# Patient Record
Sex: Female | Born: 1950 | Race: Black or African American | Hispanic: No | Smoking: Never smoker
Health system: Southern US, Community
[De-identification: ages and names within clinical notes are randomized; demographics above are authoritative.]

## PROBLEM LIST (undated history)

## (undated) DIAGNOSIS — E7800 Pure hypercholesterolemia, unspecified: Secondary | ICD-10-CM

## (undated) DIAGNOSIS — I10 Essential (primary) hypertension: Secondary | ICD-10-CM

## (undated) HISTORY — PX: REDUCTION MAMMAPLASTY: SUR839

## (undated) HISTORY — PX: BREAST SURGERY: SHX581

## (undated) HISTORY — DX: Pure hypercholesterolemia, unspecified: E78.00

## (undated) HISTORY — PX: CATARACT EXTRACTION, BILATERAL: SHX1313

## (undated) HISTORY — PX: ABDOMINAL HYSTERECTOMY: SHX81

## (undated) HISTORY — DX: Essential (primary) hypertension: I10

---

## 1998-07-12 ENCOUNTER — Other Ambulatory Visit: Admission: RE | Admit: 1998-07-12 | Discharge: 1998-07-12 | Payer: Self-pay | Admitting: Gynecology

## 1999-07-11 ENCOUNTER — Other Ambulatory Visit: Admission: RE | Admit: 1999-07-11 | Discharge: 1999-07-11 | Payer: Self-pay | Admitting: Gynecology

## 1999-11-06 ENCOUNTER — Encounter: Payer: Self-pay | Admitting: Gynecology

## 1999-11-06 ENCOUNTER — Encounter: Admission: RE | Admit: 1999-11-06 | Discharge: 1999-11-06 | Payer: Self-pay | Admitting: Gynecology

## 2000-10-01 ENCOUNTER — Other Ambulatory Visit: Admission: RE | Admit: 2000-10-01 | Discharge: 2000-10-01 | Payer: Self-pay | Admitting: Gynecology

## 2000-11-14 ENCOUNTER — Encounter: Payer: Self-pay | Admitting: Gynecology

## 2000-11-14 ENCOUNTER — Encounter: Admission: RE | Admit: 2000-11-14 | Discharge: 2000-11-14 | Payer: Self-pay | Admitting: Gynecology

## 2001-09-15 ENCOUNTER — Other Ambulatory Visit: Admission: RE | Admit: 2001-09-15 | Discharge: 2001-09-15 | Payer: Self-pay | Admitting: Gynecology

## 2001-10-27 ENCOUNTER — Encounter (INDEPENDENT_AMBULATORY_CARE_PROVIDER_SITE_OTHER): Payer: Self-pay | Admitting: *Deleted

## 2001-10-27 ENCOUNTER — Ambulatory Visit (HOSPITAL_BASED_OUTPATIENT_CLINIC_OR_DEPARTMENT_OTHER): Admission: RE | Admit: 2001-10-27 | Discharge: 2001-10-28 | Payer: Self-pay | Admitting: Specialist

## 2002-09-16 ENCOUNTER — Other Ambulatory Visit: Admission: RE | Admit: 2002-09-16 | Discharge: 2002-09-16 | Payer: Self-pay | Admitting: Gynecology

## 2003-02-04 ENCOUNTER — Encounter: Admission: RE | Admit: 2003-02-04 | Discharge: 2003-02-04 | Payer: Self-pay | Admitting: Specialist

## 2003-02-04 ENCOUNTER — Encounter: Payer: Self-pay | Admitting: Specialist

## 2003-02-15 ENCOUNTER — Encounter: Payer: Self-pay | Admitting: Specialist

## 2003-02-15 ENCOUNTER — Encounter: Admission: RE | Admit: 2003-02-15 | Discharge: 2003-02-15 | Payer: Self-pay | Admitting: Specialist

## 2003-09-28 ENCOUNTER — Encounter: Admission: RE | Admit: 2003-09-28 | Discharge: 2003-09-28 | Payer: Self-pay | Admitting: Specialist

## 2003-09-28 ENCOUNTER — Other Ambulatory Visit: Admission: RE | Admit: 2003-09-28 | Discharge: 2003-09-28 | Payer: Self-pay | Admitting: Gynecology

## 2004-11-02 ENCOUNTER — Encounter: Admission: RE | Admit: 2004-11-02 | Discharge: 2004-11-02 | Payer: Self-pay | Admitting: Gynecology

## 2005-10-12 ENCOUNTER — Other Ambulatory Visit: Admission: RE | Admit: 2005-10-12 | Discharge: 2005-10-12 | Payer: Self-pay | Admitting: Family Medicine

## 2006-04-10 ENCOUNTER — Encounter: Admission: RE | Admit: 2006-04-10 | Discharge: 2006-04-10 | Payer: Self-pay | Admitting: Family Medicine

## 2006-12-13 ENCOUNTER — Encounter: Admission: RE | Admit: 2006-12-13 | Discharge: 2006-12-13 | Payer: Self-pay | Admitting: Family Medicine

## 2007-05-29 ENCOUNTER — Encounter: Admission: RE | Admit: 2007-05-29 | Discharge: 2007-05-29 | Payer: Self-pay | Admitting: Gynecology

## 2008-06-01 ENCOUNTER — Encounter: Admission: RE | Admit: 2008-06-01 | Discharge: 2008-06-01 | Payer: Self-pay | Admitting: Gynecology

## 2009-07-28 ENCOUNTER — Encounter: Admission: RE | Admit: 2009-07-28 | Discharge: 2009-07-28 | Payer: Self-pay | Admitting: Gynecology

## 2010-08-28 ENCOUNTER — Encounter: Admission: RE | Admit: 2010-08-28 | Discharge: 2010-08-28 | Payer: Self-pay | Admitting: Family Medicine

## 2011-03-16 NOTE — Op Note (Signed)
[facility]  Patient:    Toni Shaw, Toni Shaw Visit Number: [id number] MRN: [id number]          Service Type: DSU Location: [facility] Attending Physician:  Gustavus Messing Dictated by:   Yaakov Guthrie. Shon Hough, M.D. Proc. Date: 10/27/01 Admit Date:  10/27/2001                             Operative Report  CLINICAL NOTE:  This is a 60 year old lady with severe, severe macromastia. She is only about 5 feet tall and has triple D to E bra size breasts.  She has had increased pain, discomfort, intertrigo.  She has taken medications including analgesia, Tylenol, Motrin, and Advil, with no avail.  PROCEDURES PLANNED:  Bilateral breast reductions using the inferior pedicle technique, with excision of accessory breast tissue in the right and left axillary and latissimus dorsi regions.  SURGEON:  Yaakov Guthrie. Shon Hough, M.D.  ASSISTANT:  Margaretha Sheffield.  ANESTHESIA:  General.  DESCRIPTION OF PROCEDURE:  Preoperatively the patient was sat up and drawn up for the inferior pedicle reduction mammoplasty, re-marking the nipple-areolar complexes from 20 cm from the suprasternal notch to the area.  After this, the patient underwent general anesthesia, intubated orally.  Prep was done to the chest and breast areas in a routine fashion using Betadine soap and solution, walled off with sterile towels and drapes so as to make a sterile field. Xylocaine 0.25% with epinephrine was injected locally, a total of 150 cc per side.  The areas were scored with #15 blade, then the skin over the inferior pedicle was de-epithelialized with a #20 blade.  The medial and lateral fatty dermal pedicles were incised down to the underlying subcutaneous tissue. AFter proper hemostasis, the new keyhole area was also debulked.  Laterally more tissue was taken out laterally in the axillary areas.  Liposuction was fashioned to remove large volumes of accessory breast tissue.  After  proper hemostasis, flaps were transposed and stayed with 3-0 Prolene.  Subcutaneous closure was done with 3-0 Monocryl x 2 layers, then a running subcuticular stitch of 3-0 Monocryl and 5-0 Monocryl throughout the inverted T.  The wounds were drained with #10 Blake drains, which were placed in the depths of the wound and brought out through the lateral most portion of the incision and secured with 3-0 Prolene.  At the end of the procedure, the nipple-areolar complexes were examined, showing excellent blood supply.  The wounds were cleansed.  Benzoin was applied, then Steri-Strips half-inch, Xeroform, 4 x 4s, ABDs, Hypafix tape.  She was then taken to recovery in excellent condition.  Patient to be admitted overnight at [facility] day surgical center, discharged in the morning.  Will see her back in the office in approximately two more days for re-examination and dressing change.  All instructions given to the family postoperatively. Dictated by:   Yaakov Guthrie. Shon Hough, M.D. Attending Physician:  Gustavus Messing DD:  10/27/01 TD:  10/27/01 Job: [id number]

## 2011-08-15 ENCOUNTER — Other Ambulatory Visit: Payer: Self-pay | Admitting: Family Medicine

## 2011-08-15 DIAGNOSIS — Z1231 Encounter for screening mammogram for malignant neoplasm of breast: Secondary | ICD-10-CM

## 2011-08-30 ENCOUNTER — Ambulatory Visit
Admission: RE | Admit: 2011-08-30 | Discharge: 2011-08-30 | Disposition: A | Discharge status: Home / Self Care | Payer: 59 | Source: Ambulatory Visit | Attending: Family Medicine | Admitting: Family Medicine

## 2011-08-30 DIAGNOSIS — Z1231 Encounter for screening mammogram for malignant neoplasm of breast: Secondary | ICD-10-CM

## 2012-08-20 ENCOUNTER — Other Ambulatory Visit: Payer: Self-pay | Admitting: Gynecology

## 2012-08-20 DIAGNOSIS — Z1231 Encounter for screening mammogram for malignant neoplasm of breast: Secondary | ICD-10-CM

## 2012-08-20 DIAGNOSIS — Z9889 Other specified postprocedural states: Secondary | ICD-10-CM

## 2012-09-08 ENCOUNTER — Ambulatory Visit
Admission: RE | Admit: 2012-09-08 | Discharge: 2012-09-08 | Disposition: A | Discharge status: Home / Self Care | Payer: 59 | Source: Ambulatory Visit | Attending: Gynecology | Admitting: Gynecology

## 2012-09-08 DIAGNOSIS — Z9889 Other specified postprocedural states: Secondary | ICD-10-CM

## 2012-09-08 DIAGNOSIS — Z1231 Encounter for screening mammogram for malignant neoplasm of breast: Secondary | ICD-10-CM

## 2013-08-18 ENCOUNTER — Other Ambulatory Visit: Payer: Self-pay

## 2013-08-18 DIAGNOSIS — Z1231 Encounter for screening mammogram for malignant neoplasm of breast: Secondary | ICD-10-CM

## 2013-10-08 ENCOUNTER — Ambulatory Visit (INDEPENDENT_AMBULATORY_CARE_PROVIDER_SITE_OTHER): Payer: 59 | Admitting: Gynecology

## 2013-10-08 ENCOUNTER — Encounter: Payer: Self-pay | Admitting: Gynecology

## 2013-10-08 VITALS — BP 124/78 | Ht 60.0 in | Wt 133.0 lb

## 2013-10-08 DIAGNOSIS — Z01419 Encounter for gynecological examination (general) (routine) without abnormal findings: Secondary | ICD-10-CM

## 2013-10-08 DIAGNOSIS — N952 Postmenopausal atrophic vaginitis: Secondary | ICD-10-CM

## 2013-10-08 NOTE — Progress Notes (Addendum)
Toni Shaw 1951/08/29 [id number]        62 y.o.  G1P1 new patient for annual exam.  Former patient of Dr. Nicholas Lose.  Doing well without complaints.  Past medical history,surgical history, problem list, medications, allergies, family history and social history were all reviewed and documented in the EPIC chart.  ROS:  Performed and pertinent positives and negatives are included in the history, assessment and plan .  Exam: Kim assistant Filed Vitals:   10/08/13 1035  BP: 124/78  Height: 5' (1.524 m)  Weight: 133 lb (60.328 kg)   General appearance  Normal Skin grossly normal Head/Neck normal with no cervical or supraclavicular adenopathy thyroid normal Lungs  clear Cardiac RR, without RMG Abdominal  soft, nontender, without masses, organomegaly or hernia Breasts  examined lying and sitting without masses, retractions, discharge or axillary adenopathy. Bilateral reduction scars. Pelvic  Ext/BUS/vagina  Normal with mild atrophic changes   Adnexa  Without masses or tenderness    Anus and perineum  Normal   Rectovaginal  Normal sphincter tone without palpated masses or tenderness.    Assessment/Plan:  62 y.o. G1P1 female for annual exam.   1. Postmenopausal/atrophic genital changes. Status post TAH for leiomyoma at age 34.  Patient without significant symptoms of hot flushes, night sweats, vaginal dryness or dyspareunia. Will continue to monitor. 2. Pap smear reported 2013. No Pap smear done today. No history of abnormal Pap smears previously. Review current screening guidelines and options to stop screening altogether as she is status post hysterectomy for benign indications or less frequent screening intervals reviewed. We'll readdress on an annual basis. 3. Mammography due now and patient is to schedule. SBE monthly reviewed. 4. DEXA 2014 reported normal through her primary physician's office. Repeat at 5 year interval. Increase calcium vitamin D reviewed. 5. Colonoscopy 2008 with  recommended repeat interval 10 years. 6. Health maintenance. No routine blood work done as this is all done through her primary physician's office. Followup one year, sooner as needed   Note: This document was prepared with digital dictation and possible smart phrase technology. Any transcriptional errors that result from this process are unintentional.   Dara Lords MD, 11:14 AM 10/08/2013

## 2013-10-08 NOTE — Patient Instructions (Signed)
Follow up in one year for annual exam 

## 2013-10-09 LAB — URINALYSIS W MICROSCOPIC + REFLEX CULTURE
Casts: NONE SEEN
Crystals: NONE SEEN
Glucose, UA: NEGATIVE mg/dL
Ketones, ur: NEGATIVE mg/dL
Leukocytes, UA: NEGATIVE
Nitrite: NEGATIVE
Specific Gravity, Urine: 1.018 (ref 1.005–1.030)
pH: 6 (ref 5.0–8.0)

## 2013-10-30 ENCOUNTER — Ambulatory Visit
Admission: RE | Admit: 2013-10-30 | Discharge: 2013-10-30 | Disposition: A | Discharge status: Home / Self Care | Payer: 59 | Source: Ambulatory Visit

## 2013-10-30 DIAGNOSIS — Z1231 Encounter for screening mammogram for malignant neoplasm of breast: Secondary | ICD-10-CM

## 2014-08-30 ENCOUNTER — Encounter: Payer: Self-pay | Admitting: Gynecology

## 2014-10-19 ENCOUNTER — Other Ambulatory Visit: Payer: Self-pay

## 2014-10-19 DIAGNOSIS — Z1231 Encounter for screening mammogram for malignant neoplasm of breast: Secondary | ICD-10-CM

## 2014-10-19 DIAGNOSIS — Z9889 Other specified postprocedural states: Secondary | ICD-10-CM

## 2014-11-02 ENCOUNTER — Ambulatory Visit (INDEPENDENT_AMBULATORY_CARE_PROVIDER_SITE_OTHER): Payer: 59 | Admitting: Gynecology

## 2014-11-02 ENCOUNTER — Encounter: Payer: Self-pay | Admitting: Gynecology

## 2014-11-02 ENCOUNTER — Ambulatory Visit
Admission: RE | Admit: 2014-11-02 | Discharge: 2014-11-02 | Disposition: A | Discharge status: Home / Self Care | Payer: 59 | Source: Ambulatory Visit

## 2014-11-02 VITALS — BP 120/76 | Ht 60.0 in | Wt 132.0 lb

## 2014-11-02 DIAGNOSIS — Z01419 Encounter for gynecological examination (general) (routine) without abnormal findings: Secondary | ICD-10-CM

## 2014-11-02 DIAGNOSIS — Z9889 Other specified postprocedural states: Secondary | ICD-10-CM

## 2014-11-02 DIAGNOSIS — Z1231 Encounter for screening mammogram for malignant neoplasm of breast: Secondary | ICD-10-CM

## 2014-11-02 NOTE — Progress Notes (Signed)
Toni GravesBetty M Shaw 08-20-1951 [id number]        64 y.o.  G1P1 for annual exam.  Several issues noted below.  Past medical history,surgical history, problem list, medications, allergies, family history and social history were all reviewed and documented as reviewed in the EPIC chart.  ROS:  Performed with pertinent positives and negatives included in the history, assessment and plan.   Additional significant findings :  none   Exam: [facility] personassistant Filed Vitals:   11/02/14 1053  BP: 120/76  Height: 5' (1.524 m)  Weight: 132 lb (59.875 kg)   General appearance:  Normal affect, orientation and appearance. Skin: Grossly normal HEENT: Without gross lesions.  No cervical or supraclavicular adenopathy. Thyroid normal.  Lungs:  Clear without wheezing, rales or rhonchi Cardiac: RR, without RMG Abdominal:  Soft, nontender, without masses, guarding, rebound, organomegaly or hernia Breasts:  Examined lying and sitting without masses, retractions, discharge or axillary adenopathy. Pelvic:  Ext/BUS/vagina normal with mild atrophic changes  Adnexa  Without masses or tenderness    Anus and perineum  Normal   Rectovaginal  Normal sphincter tone without palpated masses or tenderness.    Assessment/Plan:  64 y.o. G1P1 female for annual exam.   1. Postmenopausal. Status post TAH for leiomyoma at age 64. No significant hot flushes, night sweats, vaginal dryness or dyspareunia. Continue to monitor. 2. Perianal itching after bowel movement. Occurs occasionally but may go months without it. Last for minutes. Is not chronic. No bleeding. Exam is normal without evidence of fissures or hemorrhoids. Recommend observation as it appears to be an infrequent occurrence and only last minutes. Follow up if becomes more consistent. 3. Pap smear reported 2013. No Pap smear done today. No history of abnormal Pap smears. Status post hysterectomy for benign indications. Options to stop screening altogether less frequent  screening intervals reviewed. Will readdress on an annual basis. 4. DEXA 2014 reported normal. Recommend repeat at age 64. Increase calcium and vitamin D reviewed. 5. Colonoscopy 2008. Recommended repeat interval reported 10 years. 6. Mammography scheduled for today. Continue with annual mammography. SBE monthly reviewed. 7. Health maintenance. No routine blood work done as she reports this done at her primary physician's office. Follow up 1 year, sooner as needed.     Dara LordsFONTAINE,Flo Berroa P MD, 11:13 AM 11/02/2014

## 2014-11-02 NOTE — Patient Instructions (Signed)
You may obtain a copy of any labs that were done today by logging onto MyChart as outlined in the instructions provided with your AVS (after visit summary). The office will not call with normal lab results but certainly if there are any significant abnormalities then we will contact you.   Health Maintenance, Female A healthy lifestyle and preventative care can promote health and wellness.  Maintain regular health, dental, and eye exams.  Eat a healthy diet. Foods like vegetables, fruits, whole grains, low-fat dairy products, and lean protein foods contain the nutrients you need without too many calories. Decrease your intake of foods high in solid fats, added sugars, and salt. Get information about a proper diet from your caregiver, if necessary.  Regular physical exercise is one of the most important things you can do for your health. Most adults should get at least 150 minutes of moderate-intensity exercise (any activity that increases your heart rate and causes you to sweat) each week. In addition, most adults need muscle-strengthening exercises on 2 or more days a week.   Maintain a healthy weight. The body mass index (BMI) is a screening tool to identify possible weight problems. It provides an estimate of body fat based on height and weight. Your caregiver can help determine your BMI, and can help you achieve or maintain a healthy weight. For adults 20 years and older:  A BMI below 18.5 is considered underweight.  A BMI of 18.5 to 24.9 is normal.  A BMI of 25 to 29.9 is considered overweight.  A BMI of 30 and above is considered obese.  Maintain normal blood lipids and cholesterol by exercising and minimizing your intake of saturated fat. Eat a balanced diet with plenty of fruits and vegetables. Blood tests for lipids and cholesterol should begin at age 61 and be repeated every 5 years. If your lipid or cholesterol levels are high, you are over 50, or you are a high risk for heart  disease, you may need your cholesterol levels checked more frequently.Ongoing high lipid and cholesterol levels should be treated with medicines if diet and exercise are not effective.  If you smoke, find out from your caregiver how to quit. If you do not use tobacco, do not start.  Lung cancer screening is recommended for adults aged 33 80 years who are at high risk for developing lung cancer because of a history of smoking. Yearly low-dose computed tomography (CT) is recommended for people who have at least a 30-pack-year history of smoking and are a current smoker or have quit within the past 15 years. A pack year of smoking is smoking an average of 1 pack of cigarettes a day for 1 year (for example: 1 pack a day for 30 years or 2 packs a day for 15 years). Yearly screening should continue until the smoker has stopped smoking for at least 15 years. Yearly screening should also be stopped for people who develop a health problem that would prevent them from having lung cancer treatment.  If you are pregnant, do not drink alcohol. If you are breastfeeding, be very cautious about drinking alcohol. If you are not pregnant and choose to drink alcohol, do not exceed 1 drink per day. One drink is considered to be 12 ounces (355 mL) of beer, 5 ounces (148 mL) of wine, or 1.5 ounces (44 mL) of liquor.  Avoid use of street drugs. Do not share needles with anyone. Ask for help if you need support or instructions about stopping  the use of drugs.  High blood pressure causes heart disease and increases the risk of stroke. Blood pressure should be checked at least every 1 to 2 years. Ongoing high blood pressure should be treated with medicines, if weight loss and exercise are not effective.  If you are 59 to 64 years old, ask your caregiver if you should take aspirin to prevent strokes.  Diabetes screening involves taking a blood sample to check your fasting blood sugar level. This should be done once every 3  years, after age 91, if you are within normal weight and without risk factors for diabetes. Testing should be considered at a younger age or be carried out more frequently if you are overweight and have at least 1 risk factor for diabetes.  Breast cancer screening is essential preventative care for women. You should practice "breast self-awareness." This means understanding the normal appearance and feel of your breasts and may include breast self-examination. Any changes detected, no matter how small, should be reported to a caregiver. Women in their 66s and 30s should have a clinical breast exam (CBE) by a caregiver as part of a regular health exam every 1 to 3 years. After age 101, women should have a CBE every year. Starting at age 100, women should consider having a mammogram (breast X-ray) every year. Women who have a family history of breast cancer should talk to their caregiver about genetic screening. Women at a high risk of breast cancer should talk to their caregiver about having an MRI and a mammogram every year.  Breast cancer gene (BRCA)-related cancer risk assessment is recommended for women who have family members with BRCA-related cancers. BRCA-related cancers include breast, ovarian, tubal, and peritoneal cancers. Having family members with these cancers may be associated with an increased risk for harmful changes (mutations) in the breast cancer genes BRCA1 and BRCA2. Results of the assessment will determine the need for genetic counseling and BRCA1 and BRCA2 testing.  The Pap test is a screening test for cervical cancer. Women should have a Pap test starting at age 57. Between ages 25 and 35, Pap tests should be repeated every 2 years. Beginning at age 37, you should have a Pap test every 3 years as long as the past 3 Pap tests have been normal. If you had a hysterectomy for a problem that was not cancer or a condition that could lead to cancer, then you no longer need Pap tests. If you are  between ages 50 and 76, and you have had normal Pap tests going back 10 years, you no longer need Pap tests. If you have had past treatment for cervical cancer or a condition that could lead to cancer, you need Pap tests and screening for cancer for at least 20 years after your treatment. If Pap tests have been discontinued, risk factors (such as a new sexual partner) need to be reassessed to determine if screening should be resumed. Some women have medical problems that increase the chance of getting cervical cancer. In these cases, your caregiver may recommend more frequent screening and Pap tests.  The human papillomavirus (HPV) test is an additional test that may be used for cervical cancer screening. The HPV test looks for the virus that can cause the cell changes on the cervix. The cells collected during the Pap test can be tested for HPV. The HPV test could be used to screen women aged 44 years and older, and should be used in women of any age  who have unclear Pap test results. After the age of 55, women should have HPV testing at the same frequency as a Pap test.  Colorectal cancer can be detected and often prevented. Most routine colorectal cancer screening begins at the age of 44 and continues through age 20. However, your caregiver may recommend screening at an earlier age if you have risk factors for colon cancer. On a yearly basis, your caregiver may provide home test kits to check for hidden blood in the stool. Use of a small camera at the end of a tube, to directly examine the colon (sigmoidoscopy or colonoscopy), can detect the earliest forms of colorectal cancer. Talk to your caregiver about this at age 86, when routine screening begins. Direct examination of the colon should be repeated every 5 to 10 years through age 13, unless early forms of pre-cancerous polyps or small growths are found.  Hepatitis C blood testing is recommended for all people born from [birth year] through 1965 and any  individual with known risks for hepatitis C.  Practice safe sex. Use condoms and avoid high-risk sexual practices to reduce the spread of sexually transmitted infections (STIs). Sexually active women aged 36 and younger should be checked for Chlamydia, which is a common sexually transmitted infection. Older women with new or multiple partners should also be tested for Chlamydia. Testing for other STIs is recommended if you are sexually active and at increased risk.  Osteoporosis is a disease in which the bones lose minerals and strength with aging. This can result in serious bone fractures. The risk of osteoporosis can be identified using a bone density scan. Women ages 20 and over and women at risk for fractures or osteoporosis should discuss screening with their caregivers. Ask your caregiver whether you should be taking a calcium supplement or vitamin D to reduce the rate of osteoporosis.  Menopause can be associated with physical symptoms and risks. Hormone replacement therapy is available to decrease symptoms and risks. You should talk to your caregiver about whether hormone replacement therapy is right for you.  Use sunscreen. Apply sunscreen liberally and repeatedly throughout the day. You should seek shade when your shadow is shorter than you. Protect yourself by wearing long sleeves, pants, a wide-brimmed hat, and sunglasses year round, whenever you are outdoors.  Notify your caregiver of new moles or changes in moles, especially if there is a change in shape or color. Also notify your caregiver if a mole is larger than the size of a pencil eraser.  Stay current with your immunizations. Document Released: 04/30/2011 Document Revised: 02/09/2013 Document Reviewed: 04/30/2011 [facility] Patient Information 2014 [employer].

## 2014-11-03 LAB — URINALYSIS W MICROSCOPIC + REFLEX CULTURE
Bilirubin Urine: NEGATIVE
Casts: NONE SEEN
Crystals: NONE SEEN
Glucose, UA: NEGATIVE mg/dL
HGB URINE DIPSTICK: NEGATIVE
KETONES UR: NEGATIVE mg/dL
LEUKOCYTES UA: NEGATIVE
NITRITE: NEGATIVE
PH: 6 (ref 5.0–8.0)
PROTEIN: NEGATIVE mg/dL
Specific Gravity, Urine: 1.028 (ref 1.005–1.030)
UROBILINOGEN UA: 1 mg/dL (ref 0.0–1.0)

## 2014-11-04 ENCOUNTER — Other Ambulatory Visit: Payer: Self-pay | Admitting: Gynecology

## 2014-11-04 LAB — URINE CULTURE: Colony Count: >=100000

## 2014-11-04 MED ORDER — AMPICILLIN 500 MG PO CAPS: 500.0000 mg | ORAL_CAPSULE | Freq: Four times a day (QID) | ORAL | Status: DC

## 2015-07-13 ENCOUNTER — Encounter: Payer: Self-pay | Admitting: Cardiovascular Disease

## 2015-07-13 ENCOUNTER — Ambulatory Visit (INDEPENDENT_AMBULATORY_CARE_PROVIDER_SITE_OTHER): Payer: Commercial Managed Care - HMO | Admitting: Cardiovascular Disease

## 2015-07-13 VITALS — BP 120/70 | HR 71 | Resp 16 | Ht 59.0 in | Wt 134.9 lb

## 2015-07-13 DIAGNOSIS — I10 Essential (primary) hypertension: Secondary | ICD-10-CM | POA: Diagnosis not present

## 2015-07-13 DIAGNOSIS — E7800 Pure hypercholesterolemia, unspecified: Secondary | ICD-10-CM

## 2015-07-13 NOTE — Patient Instructions (Signed)
Your physician wants you to follow-up in: 1 year or sooner if needed. You will receive a reminder letter in the mail two months in advance. If you don't receive a letter, please call our office to schedule the follow-up appointment.  

## 2015-07-13 NOTE — Progress Notes (Signed)
Patient ID: Toni Shaw, female   DOB: 05/02/1951, 64 y.o.   MRN: [id number]      Cardiology Office Note   Date:  07/13/2015   ID:  Toni Shaw, DOB 1950/11/02, MRN [id number]  PCP:  Lupita Raider, MD  Cardiologist:  Thurmon Fair, MD   Chief Complaint  Patient presents with  . Re-establishing Care      History of Present Illness: Toni Shaw is a 64 y.o. female who presents for  The first cardiology evaluation in over 3 years. She has hypertension and hyperlipidemia and family history of coronary disease (in her mother at a relatively young age; her mother initially had coronary problems in her early 26s and is still alive at age 52).   Since her last appointment limits P[place]att has not developed symptoms of heart disease. In fact she is exercising with one of her friends 3 days a week 30 minutes, every morning from 6:30 to 7:30, a combination of weight exercises and aerobics. This makes her feel better.  She is only mildly overweight.  She had labs with her primary care physician a long ago. She recalls that her total cholesterol was less than 200 and that her LDL cholesterol was acceptable , if not perfect. I don't have the exact report right now.  She snores frequently. She scores 9 points on the Epworth Sleepiness Scale. She has never been told about witnessed apnea. She wakes up in the morning feeling quite refreshed.   Past Medical History  Diagnosis Date  . Hypertension   . Elevated cholesterol     Past Surgical History  Procedure Laterality Date  . Breast surgery      Reduction  . Abdominal hysterectomy  age 42    leiomyomata    Current Outpatient Prescriptions  Medication Sig Dispense Refill  . aspirin 81 MG tablet Take 81 mg by mouth daily.    Marland Kitchen atenolol-chlorthalidone (TENORETIC) 50-25 MG per tablet Take 1 tablet by mouth daily.    Marland Kitchen atorvastatin (LIPITOR) 20 MG tablet Take 20 mg by mouth daily.    . Cholecalciferol (VITAMIN D PO) Take by mouth.    .  Multiple Vitamin (MULTIVITAMIN) tablet Take 1 tablet by mouth daily.    . Omega-3 Fatty Acids (FISH OIL PO) Take by mouth.    . travoprost, benzalkonium, (TRAVATAN) 0.004 % ophthalmic solution 1 drop at bedtime.     No current facility-administered medications for this visit.    Allergies:   Review of patient's allergies indicates no known allergies.    Social History:  The patient  reports that she has never smoked. She does not have any smokeless tobacco history on file. She reports that she does not drink alcohol or use illicit drugs.   Family History:  The patient's family history includes Breast cancer (age of onset: 70) in her mother; Diabetes in her brother; Heart disease in her maternal grandfather and maternal grandmother.    ROS:  Please see the history of present illness.    Otherwise, review of systems positive for none.   All other systems are reviewed and negative.    PHYSICAL EXAM: VS:  BP 120/70 mmHg  Pulse 71  Resp 16  Ht 4\' 11"  (1.499 m)  Wt 134 lb 14.4 oz (61.19 kg)  BMI 27.23 kg/m2 , BMI Body mass index is 27.23 kg/(m^2).  General: Alert, oriented x3, no distress Head: no evidence of trauma, PERRL, EOMI, no exophtalmos or lid lag, no myxedema, no xanthelasma; normal  ears, nose and oropharynx Neck: normal jugular venous pulsations and no hepatojugular reflux; brisk carotid pulses without delay and no carotid bruits Chest: clear to auscultation, no signs of consolidation by percussion or palpation, normal fremitus, symmetrical and full respiratory excursions Cardiovascular: normal position and quality of the apical impulse, regular rhythm, normal first and second heart sounds, no murmurs, rubs or gallops Abdomen: no tenderness or distention, no masses by palpation, no abnormal pulsatility or arterial bruits, normal bowel sounds, no hepatosplenomegaly Extremities: no clubbing, cyanosis or edema; 2+ radial, ulnar and brachial pulses bilaterally; 2+ right femoral,  posterior tibial and dorsalis pedis pulses; 2+ left femoral, posterior tibial and dorsalis pedis pulses; no subclavian or femoral bruits Neurological: grossly nonfocal Psych: euthymic mood, full affect   EKG:  EKG is ordered today. The ekg ordered today demonstrates NSR   Recent Labs: No results found for requested labs within last 365 days.    Lipid Panel No results found for: CHOL, TRIG, HDL, CHOLHDL, VLDL, LDLCALC, LDLDIRECT    Wt Readings from Last 3 Encounters:  07/13/15 134 lb 14.4 oz (61.19 kg)  11/02/14 132 lb (59.875 kg)  10/08/13 133 lb (60.328 kg)     ASSESSMENT AND PLAN:  1.  Essential hypertension, well controlled 2.  Mildly overweight 3.  Hyperlipidemia, reportedly satisfactory parameters on recent lipid profile 4.  Equivocal symptoms for obstructive sleep apnea -  She will ask her husband for more information, including about witnessed sleep apnea.   she had a nondiagnostic cardiopulmonary exercise stress test in 2013 (could not achieve target heart rate) but the findings were normal for the level of exercise achieved. She is completely asymptomatic. I don't think she needs additional testing at this time, but would focus on continued management of her risk factors.  Current medicines are reviewed at length with the patient today.  The patient does not have concerns regarding medicines.  The following changes have been made:  no change  Labs/ tests ordered today include:   Orders Placed This Encounter  Procedures  . EKG 12-Lead      Patient Instructions  Your physician wants you to follow-up in: 1 year or sooner if needed. You will receive a reminder letter in the mail two months in advance. If you don't receive a letter, please call our office to schedule the follow-up appointment.   Joie Bimler, MD  07/13/2015 4:24 PM    Thurmon Fair, MD, [facility] [phone] office [phone] pager

## 2015-07-18 ENCOUNTER — Encounter: Payer: Self-pay | Admitting: Cardiovascular Disease

## 2015-10-05 ENCOUNTER — Other Ambulatory Visit: Payer: Self-pay

## 2015-10-05 DIAGNOSIS — Z1231 Encounter for screening mammogram for malignant neoplasm of breast: Secondary | ICD-10-CM

## 2015-11-04 ENCOUNTER — Encounter: Payer: 59 | Admitting: Gynecology

## 2015-11-08 ENCOUNTER — Ambulatory Visit: Payer: Commercial Managed Care - HMO

## 2015-11-22 ENCOUNTER — Ambulatory Visit
Admission: RE | Admit: 2015-11-22 | Discharge: 2015-11-22 | Disposition: A | Discharge status: Home / Self Care | Payer: Commercial Managed Care - HMO | Source: Ambulatory Visit

## 2015-11-22 DIAGNOSIS — Z1231 Encounter for screening mammogram for malignant neoplasm of breast: Secondary | ICD-10-CM

## 2015-11-24 ENCOUNTER — Other Ambulatory Visit: Payer: Self-pay | Admitting: Family Medicine

## 2015-11-24 DIAGNOSIS — R928 Other abnormal and inconclusive findings on diagnostic imaging of breast: Secondary | ICD-10-CM

## 2015-11-29 ENCOUNTER — Ambulatory Visit
Admission: RE | Admit: 2015-11-29 | Discharge: 2015-11-29 | Disposition: A | Discharge status: Home / Self Care | Payer: Commercial Managed Care - HMO | Source: Ambulatory Visit | Attending: Family Medicine | Admitting: Family Medicine

## 2015-11-29 DIAGNOSIS — R928 Other abnormal and inconclusive findings on diagnostic imaging of breast: Secondary | ICD-10-CM

## 2015-12-23 ENCOUNTER — Other Ambulatory Visit (HOSPITAL_COMMUNITY)
Admission: RE | Admit: 2015-12-23 | Discharge: 2015-12-23 | Disposition: A | Discharge status: Home / Self Care | Payer: Commercial Managed Care - HMO | Source: Ambulatory Visit | Attending: Gynecology | Admitting: Gynecology

## 2015-12-23 ENCOUNTER — Encounter: Payer: Self-pay | Admitting: Gynecology

## 2015-12-23 ENCOUNTER — Ambulatory Visit (INDEPENDENT_AMBULATORY_CARE_PROVIDER_SITE_OTHER): Payer: Commercial Managed Care - HMO | Admitting: Gynecology

## 2015-12-23 VITALS — BP 120/76 | Ht 60.0 in | Wt 132.0 lb

## 2015-12-23 DIAGNOSIS — N898 Other specified noninflammatory disorders of vagina: Secondary | ICD-10-CM | POA: Diagnosis not present

## 2015-12-23 DIAGNOSIS — N952 Postmenopausal atrophic vaginitis: Secondary | ICD-10-CM | POA: Diagnosis not present

## 2015-12-23 DIAGNOSIS — Z01419 Encounter for gynecological examination (general) (routine) without abnormal findings: Secondary | ICD-10-CM | POA: Diagnosis present

## 2015-12-23 LAB — WET PREP FOR TRICH, YEAST, CLUE
CLUE CELLS WET PREP: NONE SEEN
Trich, Wet Prep: NONE SEEN
YEAST WET PREP: NONE SEEN

## 2015-12-23 MED ORDER — METRONIDAZOLE 500 MG PO TABS: 500.0000 mg | ORAL_TABLET | Freq: Two times a day (BID) | ORAL | Status: DC

## 2015-12-23 NOTE — Progress Notes (Signed)
CHUDNEY SCHEFFLER [birth year] [id number]        65 y.o.  G1P1  for annual exam.  Doing well  Past medical history,surgical history, problem list, medications, allergies, family history and social history were all reviewed and documented as reviewed in the EPIC chart.  ROS:  Performed with pertinent positives and negatives included in the history, assessment and plan.   Additional significant findings :  none   Exam: [name] assistant Filed Vitals:   12/23/15 1007  BP: 120/76  Height: 5' (1.524 m)  Weight: 132 lb (59.875 kg)   General appearance:  Normal affect, orientation and appearance. Skin: Grossly normal HEENT: Without gross lesions.  No cervical or supraclavicular adenopathy. Thyroid normal.  Lungs:  Clear without wheezing, rales or rhonchi Cardiac: RR, without RMG Abdominal:  Soft, nontender, without masses, guarding, rebound, organomegaly or hernia Breasts:  Examined lying and sitting without masses, retractions, discharge or axillary adenopathy. Pelvic:  Ext/BUS/vagina with atrophic changes. Slight white discharge noted.  Adnexa without masses or tenderness    Anus and perineum normal   Rectovaginal normal sphincter tone without palpated masses or tenderness.    Assessment/Plan:  65 y.o. G1P1 female for annual exam.   1. Status post TAH for leiomyomata at age 26. No significant hot flushes, night sweats or vaginal dryness. 2. Vaginal discharge noted heavier than normal. No itching, irritation or odor.  Wet prep is unremarkable. Going to treat for low-grade bacterial vaginosis with Flagyl 500 mg twice a day 7 days. Follow up if symptoms persist or worsen. 3. Pap smear 2013. Pap smear done today as I have no records from prior Paps. No history of abnormal Paps. Status post hysterectomy for benign indications. 4. Mammography 10/2015. Continue with annual mammography when due. SBE monthly reviewed. 5. DEXA 2014 reported normal. Plan repeat at age 69. Increased calcium and  vitamin D. 6. Colonoscopy 2008 with planned repeat next year. 7. Health maintenance. No routine blood work done as she reports this done at her primary physician's office. Follow up 1 year, sooner as needed.   Dara Lords MD, 10:48 AM 12/23/2015

## 2015-12-23 NOTE — Patient Instructions (Signed)
Take the antibiotic pill twice daily for 7 days. Follow up if your discharge continues. Do not drink alcohol while taking the antibiotic

## 2015-12-23 NOTE — Addendum Note (Signed)
Addended by: Dayna Barker on: 12/23/2015 10:53 AM   Modules accepted: Orders

## 2015-12-26 LAB — CYTOLOGY - PAP

## 2016-07-13 ENCOUNTER — Ambulatory Visit (INDEPENDENT_AMBULATORY_CARE_PROVIDER_SITE_OTHER): Payer: Commercial Managed Care - HMO | Admitting: Cardiovascular Disease

## 2016-07-13 ENCOUNTER — Encounter: Payer: Self-pay | Admitting: Cardiovascular Disease

## 2016-07-13 VITALS — BP 155/78 | HR 64 | Ht 59.0 in | Wt 127.4 lb

## 2016-07-13 DIAGNOSIS — E7800 Pure hypercholesterolemia, unspecified: Secondary | ICD-10-CM

## 2016-07-13 DIAGNOSIS — Z9189 Other specified personal risk factors, not elsewhere classified: Secondary | ICD-10-CM | POA: Insufficient documentation

## 2016-07-13 DIAGNOSIS — Z87898 Personal history of other specified conditions: Secondary | ICD-10-CM | POA: Diagnosis not present

## 2016-07-13 DIAGNOSIS — I10 Essential (primary) hypertension: Secondary | ICD-10-CM | POA: Diagnosis not present

## 2016-07-13 NOTE — Patient Instructions (Signed)
Medication Instructions:   NO CHANGE  Testing/Procedures:  Your physician has requested that you have an exercise tolerance test. For further information please visit [email or web address] Please also follow instruction sheet, as given.    Follow-Up:  Your physician wants you to follow-up in: ONE YEAR WITH DR Royann ShiversROITORU You will receive a reminder letter in the mail two months in advance. If you don't receive a letter, please call our office to schedule the follow-up appointment.   If you need a refill on your cardiac medications before your next appointment, please call your pharmacy.    Exercise Stress Electrocardiogram An exercise stress electrocardiogram is a test to check how blood flows to your heart. It is done to find areas of poor blood flow. You will need to walk on a treadmill for this test. The electrocardiogram will record your heartbeat when you are at rest and when you are exercising. BEFORE THE PROCEDURE  Do not have drinks with caffeine or foods with caffeine for 24 hours before the test, or as told by your doctor. This includes coffee, tea (even decaf tea), sodas, chocolate, and cocoa.  Follow your doctor's instructions about eating and drinking before the test.  Ask your doctor what medicines you should or should not take before the test. Take your medicines with water unless told by your doctor not to.  If you use an inhaler, bring it with you to the test.  Bring a snack to eat after the test.  Do not  smoke for 4 hours before the test.  Do not put lotions, powders, creams, or oils on your chest before the test.  Wear comfortable shoes and clothing. PROCEDURE  You will have patches put on your chest. Small areas of your chest may need to be shaved. Wires will be connected to the patches.  Your heart rate will be watched while you are resting and while you are exercising.  You will walk on the treadmill. The treadmill will slowly get faster to raise your heart  rate.  The test will take about 1-2 hours. AFTER THE PROCEDURE  Your heart rate and blood pressure will be watched after the test.  You may return to your normal diet, activities, and medicines or as told by your doctor.   This information is not intended to replace advice given to you by your health care provider. Make sure you discuss any questions you have with your health care provider.   Document Released: 04/02/2008 Document Revised: 11/05/2014 Document Reviewed: 06/22/2013 Elsevier Interactive Patient Education [employer]2016 Elsevier Inc.

## 2016-07-13 NOTE — Progress Notes (Signed)
Cardiology Office Note    Date:  07/13/2016   ID:  Toni Shaw, DOB 1950-11-06, MRN [id number]  [name],[name], MD  Cardiologist:   Thurmon FairMihai An Schnabel, MD   Chief Complaint  Patient presents with  . Follow-up    Pt states no Sx.    History of Present Illness:  Toni GravesBetty M Shaw is a 65 y.o. female with hypertension and hyperlipidemia, returning for routine follow-up. Since her last appointment she has continued to exercise at least 3 days a week with a group of friends. She doesn't combination of aerobic exercises and weightlifting. She enjoys it a lot and has no problems with shortness of breath, dizziness, chest pain, palpitations or claudication while doing it. She had one episode of syncope last year not long after donating blood. She did not eat or drink enough after blood donation and passed out at her mother's home. Electrocardiogram was done by emergency medical services but she did not require hospital evaluation. It was the first time she had ever donated blood. She is scheduled for a physical exam and lab tests with her primary care physician in November. Last year her lipid profile was quite favorable. When she checks her blood pressure at home it is usually in the 120-130/70-80 range, although it is a little higher today. Over the last 4 weeks she has been helping her husband convalescd from coronary bypass surgery.   Past Medical History:  Diagnosis Date  . Elevated cholesterol   . Hypertension     Past Surgical History:  Procedure Laterality Date  . ABDOMINAL HYSTERECTOMY  age 65   leiomyomata  . BREAST SURGERY     Reduction    Current Medications: Outpatient Medications Prior to Visit  Medication Sig Dispense Refill  . aspirin 81 MG tablet Take 81 mg by mouth daily.    Marland Kitchen. atenolol-chlorthalidone (TENORETIC) 50-25 MG per tablet Take 1 tablet by mouth daily.    Marland Kitchen. atorvastatin (LIPITOR) 20 MG tablet Take 20 mg by mouth daily.    . Cholecalciferol (VITAMIN D PO)  Take by mouth.    . Multiple Vitamin (MULTIVITAMIN) tablet Take 1 tablet by mouth daily.    . Omega-3 Fatty Acids (FISH OIL PO) Take by mouth.    . travoprost, benzalkonium, (TRAVATAN) 0.004 % ophthalmic solution 1 drop at bedtime.    . metroNIDAZOLE (FLAGYL) 500 MG tablet Take 1 tablet (500 mg total) by mouth 2 (two) times daily. 14 tablet 0   No facility-administered medications prior to visit.      Allergies:   Review of patient's allergies indicates no known allergies.   Social History   Social History  . Marital status: Married    Spouse name: N/A  . Number of children: N/A  . Years of education: N/A   Social History Main Topics  . Smoking status: Never Smoker  . Smokeless tobacco: None  . Alcohol use No  . Drug use: No  . Sexual activity: Yes    Birth control/ protection: Surgical     Comment: HYST-1st intercourse 65 yo.--Fewer than 5 partners   Other Topics Concern  . None   Social History Narrative  . None     Family History:  The patient's family history includes Breast cancer (age of onset: 2374) in her mother; Cancer in her brother; Diabetes in her brother; Heart disease in her maternal grandfather and maternal grandmother.   ROS:   Please see the history of present illness.    ROS  All other systems reviewed and are negative.   PHYSICAL EXAM:   VS:  BP (!) 155/78   Pulse 64   Ht 4\' 11"  (1.499 m)   Wt 127 lb 6.4 oz (57.8 kg)   BMI 25.73 kg/m    GEN: Well nourished, well developed, in no acute distress  HEENT: normal  Neck: no JVD, carotid bruits, or masses Cardiac: RRR; no murmurs, rubs, or gallops,no edema  Respiratory:  clear to auscultation bilaterally, normal work of breathing GI: soft, nontender, nondistended, + BS MS: no deformity or atrophy  Skin: warm and dry, no rash Neuro:  Alert and Oriented x 3, Strength and sensation are intact Psych: euthymic mood, full affect  Wt Readings from Last 3 Encounters:  07/13/16 127 lb 6.4 oz (57.8 kg)    12/23/15 132 lb (59.9 kg)  07/13/15 134 lb 14.4 oz (61.2 kg)      Studies/Labs Reviewed:   EKG:  EKG is ordered today.  The ekg ordered today demonstrates NSR, first-degree block, otherwise normal   Recent Labs on 07/01/2015 Glucose 83, BUN 14, creatinine 0.88, potassium 4.2, normal liver function tests  Lipid Panel Total cholesterol 170, triglycerides 157, HDL 33, LDL 100    ASSESSMENT:    1. Essential hypertension   2. Elevated cholesterol   3. At risk for coronary artery disease      PLAN:  In order of problems listed above:  1. HTN: Blood pressure is elevated today, but this is very unusual for her. Usually at home her blood pressure is normal. Asked her to send me some blood pressure readings to my chart and we will titrate her medications if necessary. 2. HLP: We'll wait until she has her blood work performed with Dr. Clelia Croft. Last year her total and LDL cholesterol levels were fine, but her HDL was low. Continue exercising. She is very close to ideal weight. Discussed low carb diet. 3. CAD screening: [name] has numerous coronary risk factors including hypertension, hyperlipidemia and premature onset coronary disease and a first-degree female relatives. I recommended that she should undergo a treadmill stress test.    Medication Adjustments/Labs and Tests Ordered: Current medicines are reviewed at length with the patient today.  Concerns regarding medicines are outlined above.  Medication changes, Labs and Tests ordered today are listed in the Patient Instructions below. Patient Instructions  Medication Instructions:   NO CHANGE  Testing/Procedures:  Your physician has requested that you have an exercise tolerance test. For further information please visit [email or web address] Please also follow instruction sheet, as given.    Follow-Up:  Your physician wants you to follow-up in: ONE YEAR WITH DR Royann Shivers You will receive a reminder letter in the mail two  months in advance. If you don't receive a letter, please call our office to schedule the follow-up appointment.   If you need a refill on your cardiac medications before your next appointment, please call your pharmacy.    Exercise Stress Electrocardiogram An exercise stress electrocardiogram is a test to check how blood flows to your heart. It is done to find areas of poor blood flow. You will need to walk on a treadmill for this test. The electrocardiogram will record your heartbeat when you are at rest and when you are exercising. BEFORE THE PROCEDURE  Do not have drinks with caffeine or foods with caffeine for 24 hours before the test, or as told by your doctor. This includes coffee, tea (even decaf tea), sodas, chocolate, and cocoa.  Follow  your doctor's instructions about eating and drinking before the test.  Ask your doctor what medicines you should or should not take before the test. Take your medicines with water unless told by your doctor not to.  If you use an inhaler, bring it with you to the test.  Bring a snack to eat after the test.  Do not  smoke for 4 hours before the test.  Do not put lotions, powders, creams, or oils on your chest before the test.  Wear comfortable shoes and clothing. PROCEDURE  You will have patches put on your chest. Small areas of your chest may need to be shaved. Wires will be connected to the patches.  Your heart rate will be watched while you are resting and while you are exercising.  You will walk on the treadmill. The treadmill will slowly get faster to raise your heart rate.  The test will take about 1-2 hours. AFTER THE PROCEDURE  Your heart rate and blood pressure will be watched after the test.  You may return to your normal diet, activities, and medicines or as told by your doctor.   This information is not intended to replace advice given to you by your health care provider. Make sure you discuss any questions you have with your  health care provider.   Document Released: 04/02/2008 Document Revised: 11/05/2014 Document Reviewed: 06/22/2013 Elsevier Interactive Patient Education [address].       Signed, Thurmon Fair, MD  07/13/2016 9:47 AM    [facility] [address] [place], [place], [place]  [address] Phone: [phone]; Fax: [phone]

## 2016-07-20 ENCOUNTER — Telehealth (HOSPITAL_COMMUNITY): Payer: Self-pay

## 2016-07-20 NOTE — Telephone Encounter (Signed)
Encounter complete. 

## 2016-07-24 ENCOUNTER — Encounter: Payer: Self-pay | Admitting: Cardiovascular Disease

## 2016-07-25 ENCOUNTER — Ambulatory Visit (HOSPITAL_COMMUNITY)
Admission: RE | Admit: 2016-07-25 | Discharge: 2016-07-25 | Disposition: A | Discharge status: Home / Self Care | Payer: Commercial Managed Care - HMO | Source: Ambulatory Visit | Attending: Cardiovascular Disease | Admitting: Cardiovascular Disease

## 2016-07-25 ENCOUNTER — Ambulatory Visit: Payer: Commercial Managed Care - HMO | Admitting: Cardiovascular Disease

## 2016-07-25 DIAGNOSIS — I10 Essential (primary) hypertension: Secondary | ICD-10-CM

## 2016-07-25 LAB — EXERCISE TOLERANCE TEST
Estimated workload: 7.7 METS
Exercise duration (min): 6 min
Exercise duration (sec): 30 s
MPHR: 154 {beats}/min
Peak HR: 181 {beats}/min
Percent HR: 117 %
RPE: 17
Rest HR: 100 {beats}/min

## 2016-07-27 ENCOUNTER — Telehealth: Payer: Self-pay | Admitting: Cardiovascular Disease

## 2016-07-27 NOTE — Telephone Encounter (Signed)
New message   Pt verbalized that she is returning call for rn for stress test results

## 2016-07-27 NOTE — Telephone Encounter (Signed)
Returned call and advised patient of normal results (w/e of HT response to exercise)

## 2016-11-16 ENCOUNTER — Other Ambulatory Visit: Payer: Self-pay | Admitting: Gynecology

## 2016-11-16 DIAGNOSIS — Z1231 Encounter for screening mammogram for malignant neoplasm of breast: Secondary | ICD-10-CM

## 2016-11-20 ENCOUNTER — Ambulatory Visit: Payer: Commercial Managed Care - HMO | Admitting: Gynecology

## 2016-11-23 ENCOUNTER — Telehealth: Payer: Self-pay | Admitting: *Deleted

## 2016-11-23 ENCOUNTER — Ambulatory Visit (INDEPENDENT_AMBULATORY_CARE_PROVIDER_SITE_OTHER): Payer: Commercial Managed Care - HMO | Admitting: Gynecology

## 2016-11-23 ENCOUNTER — Encounter: Payer: Self-pay | Admitting: Gynecology

## 2016-11-23 VITALS — BP 122/76

## 2016-11-23 DIAGNOSIS — N644 Mastodynia: Secondary | ICD-10-CM

## 2016-11-23 NOTE — Telephone Encounter (Signed)
-----   Message from Dara Lordsimothy P Fontaine, MD sent at 11/23/2016 11:13 AM EST ----- Schedule diagnostic mammography and ultrasound at the breast center reference new onset right breast pain no palpable abnormalities on exam

## 2016-11-23 NOTE — Patient Instructions (Signed)
The breast center should call you to schedule the mammogram and ultrasound. If you do not hear from them within the next week or so call our office.

## 2016-11-23 NOTE — Progress Notes (Signed)
    Toni GravesBetty M Shaw [birth year] [id number]        66 y.o.  G1P1 presents with 2 week history of right breast pain. Patient notes some aching discomfort throughout the breast as well as sharp stabbing pains that go along the breast midline. No palpable abnormalities. No nipple discharge. No history of same before. Due for mammogram now.  Past medical history,surgical history, problem list, medications, allergies, family history and social history were all reviewed and documented in the EPIC chart.  Directed ROS with pertinent positives and negatives documented in the history of present illness/assessment and plan.  Exam: Kennon PortelaKim Shaw assistant Vitals:   11/23/16 1045  BP: 122/76   General appearance:  Normal Both breasts examined lying and sitting without masses retractions discharge adenopathy. Well-healed reduction scars bilaterally noted.  Assessment/Plan:  66 y.o. G1P1 with new onset right breast mastalgia. Exam is normal. Recommend diagnostic mammography and ultrasound rule out nonpalpable abnormalities. Assuming studies are negative recommended observation for now as this appears to be short lived. If persists or she feels any abnormality she'll represent for further evaluation.    Dara LordsFONTAINE,Carter Kassel P MD, 11:11 AM 11/23/2016

## 2016-11-23 NOTE — Telephone Encounter (Signed)
Pt scheduled on 11/27/16 @ 2:00pm at breast center, pt aware.

## 2016-11-27 ENCOUNTER — Ambulatory Visit
Admission: RE | Admit: 2016-11-27 | Discharge: 2016-11-27 | Disposition: A | Discharge status: Home / Self Care | Payer: Medicare Other | Source: Ambulatory Visit | Attending: Gynecology | Admitting: Gynecology

## 2016-11-27 ENCOUNTER — Other Ambulatory Visit: Payer: Self-pay | Admitting: Gynecology

## 2016-11-27 DIAGNOSIS — R928 Other abnormal and inconclusive findings on diagnostic imaging of breast: Secondary | ICD-10-CM

## 2016-11-27 DIAGNOSIS — N644 Mastodynia: Secondary | ICD-10-CM

## 2016-12-27 ENCOUNTER — Ambulatory Visit (INDEPENDENT_AMBULATORY_CARE_PROVIDER_SITE_OTHER): Payer: Medicare Other | Admitting: Gynecology

## 2016-12-27 ENCOUNTER — Encounter: Payer: Self-pay | Admitting: Gynecology

## 2016-12-27 VITALS — BP 124/74 | Ht 60.0 in | Wt 126.0 lb

## 2016-12-27 DIAGNOSIS — N952 Postmenopausal atrophic vaginitis: Secondary | ICD-10-CM | POA: Diagnosis not present

## 2016-12-27 DIAGNOSIS — Z01411 Encounter for gynecological examination (general) (routine) with abnormal findings: Secondary | ICD-10-CM | POA: Diagnosis not present

## 2016-12-27 NOTE — Progress Notes (Signed)
    Toni GravesBetty M Shaw [birth year] [id number]        66 y.o.  G1P1 for annual exam.    Past medical history,surgical history, problem list, medications, allergies, family history and social history were all reviewed and documented as reviewed in the EPIC chart.  ROS:  Performed with pertinent positives and negatives included in the history, assessment and plan.   Additional significant findings :  None   Exam: Kennon PortelaKim Shaw assistant Vitals:   12/27/16 0911  BP: 124/74  Weight: 126 lb (57.2 kg)  Height: 5' (1.524 m)   Body mass index is 24.61 kg/m.  General appearance:  Normal affect, orientation and appearance. Skin: Grossly normal HEENT: Without gross lesions.  No cervical or supraclavicular adenopathy. Thyroid normal.  Lungs:  Clear without wheezing, rales or rhonchi Cardiac: RR, without RMG Abdominal:  Soft, nontender, without masses, guarding, rebound, organomegaly or hernia Breasts:  Examined lying and sitting without masses, retractions, discharge or axillary adenopathy.  Bilateral reduction scars noted  Pelvic:  Ext, BUS, Vagina: With atrophic changes  Adnexa: Without masses or tenderness    Anus and perineum: Normal   Rectovaginal: Normal sphincter tone without palpated masses or tenderness.    Assessment/Plan:  66 y.o. G1P1 female for annual exam.   1. Postmenopausal/atrophic genital changes. Status post TVH for leiomyoma age 66. Doing well without significant hot flushes, night sweats, vaginal dryness. 2. Recent breast tenderness. Negative mammography 10/2016. Patient notes that her tenderness has resolved. Exam today is normal. Continue with monthly self breast exams and follow up if any issues. 3. Pap smear 2017. No Pap smear done today. No history of significant abnormal Pap smears. 4. Colonoscopy due this year and she is in the process of arranging. 5. DEXA reported normal 2014. Recommend repeat at age 66. Increased calcium vitamin D. 6. Health maintenance. No  routine blood work done as patient does this elsewhere. Follow up 1 year, sooner as needed.   Toni Shaw,Toni Tigert P MD, 9:28 AM 12/27/2016

## 2016-12-27 NOTE — Patient Instructions (Signed)

## 2017-07-19 ENCOUNTER — Ambulatory Visit (INDEPENDENT_AMBULATORY_CARE_PROVIDER_SITE_OTHER): Payer: Medicare Other | Admitting: Cardiovascular Disease

## 2017-07-19 VITALS — BP 122/80 | HR 54 | Ht 60.0 in | Wt 128.0 lb

## 2017-07-19 DIAGNOSIS — Z9189 Other specified personal risk factors, not elsewhere classified: Secondary | ICD-10-CM

## 2017-07-19 DIAGNOSIS — E7800 Pure hypercholesterolemia, unspecified: Secondary | ICD-10-CM | POA: Diagnosis not present

## 2017-07-19 DIAGNOSIS — I10 Essential (primary) hypertension: Secondary | ICD-10-CM | POA: Diagnosis not present

## 2017-07-19 NOTE — Patient Instructions (Signed)
Dr Croitoru recommends that you schedule a follow-up appointment in 6 months. You will receive a reminder letter in the mail two months in advance. If you don't receive a letter, please call our office to schedule the follow-up appointment.  If you need a refill on your cardiac medications before your next appointment, please call your pharmacy. 

## 2017-07-19 NOTE — Progress Notes (Signed)
Cardiology Office Note    Date:  07/20/2017   ID:  Ellionna, Buckbee 1950-11-24, MRN [id number]  PCP:  Lupita Raider, MD  Cardiologist:   Thurmon Fair, MD   Chief complaint: Hypertension and hyperlipidemia follow-up   History of Present Illness:  Toni Shaw is a 66 y.o. female with hypertension and hyperlipidemia, returning for routine follow-up.   After her last appointment she had a normal treadmill stress test (hypertensive response, no evidence of ischemia).  Since her last appointment her mother has passed away last 01/04/23. A couple months before that she had retired. She is now involved in caring for her grandmother who is about 60 year old years old. She continues to exercise regularly and has reached a very healthy weight with a BMI at 25. She denies exertional angina, dyspnea, claudication, leg edema, focal neurological events or dizziness. She has not had any further syncopal events (had syncope after blood donation a couple of years ago).   Past Medical History:  Diagnosis Date  . Elevated cholesterol   . Hypertension     Past Surgical History:  Procedure Laterality Date  . ABDOMINAL HYSTERECTOMY  age 60   leiomyomata  . BREAST SURGERY     Reduction    Current Medications: Outpatient Medications Prior to Visit  Medication Sig Dispense Refill  . aspirin 81 MG tablet Take 81 mg by mouth daily.    Marland Kitchen atenolol-chlorthalidone (TENORETIC) 50-25 MG per tablet Take 1 tablet by mouth daily.    Marland Kitchen atorvastatin (LIPITOR) 20 MG tablet Take 20 mg by mouth daily.    . Cholecalciferol (VITAMIN D PO) Take 1,000 mg by mouth daily.     . Multiple Vitamin (MULTIVITAMIN) tablet Take 1 tablet by mouth daily.    . Omega-3 Fatty Acids (FISH OIL PO) Take by mouth.    . travoprost, benzalkonium, (TRAVATAN) 0.004 % ophthalmic solution 1 drop at bedtime.     No facility-administered medications prior to visit.      Allergies:   Patient has no known allergies.   Social  History   Social History  . Marital status: Married    Spouse name: N/A  . Number of children: N/A  . Years of education: N/A   Social History Main Topics  . Smoking status: Never Smoker  . Smokeless tobacco: Never Used  . Alcohol use No  . Drug use: No  . Sexual activity: Yes    Birth control/ protection: Surgical     Comment: HYST-1st intercourse 66 yo.--Fewer than 5 partners   Other Topics Concern  . Not on file   Social History Narrative  . No narrative on file     Family History:  The patient's family history includes Breast cancer (age of onset: 72) in her mother; Cancer in her brother; Diabetes in her brother; Heart disease in her maternal grandfather and maternal grandmother.   ROS:   Please see the history of present illness.    ROS All other systems reviewed and are negative.   PHYSICAL EXAM:   VS:  BP 122/80 (BP Location: Right Arm, Patient Position: Sitting, Cuff Size: Normal)   Pulse (!) 54   Ht 5' (1.524 m)   Wt 128 lb (58.1 kg)   BMI 25.00 kg/m     General: Alert, oriented x3, no distress, Looks fit Head: no evidence of trauma, PERRL, EOMI, no exophtalmos or lid lag, no myxedema, no xanthelasma; normal ears, nose and oropharynx Neck: normal jugular venous pulsations and no  hepatojugular reflux; brisk carotid pulses without delay and no carotid bruits Chest: clear to auscultation, no signs of consolidation by percussion or palpation, normal fremitus, symmetrical and full respiratory excursions Cardiovascular: normal position and quality of the apical impulse, regular rhythm, normal first and second heart sounds, no murmurs, rubs or gallops Abdomen: no tenderness or distention, no masses by palpation, no abnormal pulsatility or arterial bruits, normal bowel sounds, no hepatosplenomegaly Extremities: no clubbing, cyanosis or edema; 2+ radial, ulnar and brachial pulses bilaterally; 2+ right femoral, posterior tibial and dorsalis pedis pulses; 2+ left femoral,  posterior tibial and dorsalis pedis pulses; no subclavian or femoral bruits Neurological: grossly nonfocal Psych: Normal mood and affect   Wt Readings from Last 3 Encounters:  07/19/17 128 lb (58.1 kg)  12/27/16 126 lb (57.2 kg)  07/13/16 127 lb 6.4 oz (57.8 kg)      Studies/Labs Reviewed:   EKG:  EKG is ordered today.  The ekg ordered today demonstrates Normal sinus rhythm with very mild first-degree AV block, unchanged from last year   Recent Labs on 07/01/2015 Glucose 83, BUN 14, creatinine 0.88, potassium 4.2, normal liver function tests  Lipid Panel Total cholesterol 170, triglycerides 157, HDL 33, LDL 100  ASSESSMENT:    1. Essential hypertension   2. Elevated cholesterol   3. At risk for coronary artery disease      PLAN:  In order of problems listed above:  1. HTN: Excellent blood pressure control 2. HLP: Checked yearly by Dr. Clelia Croft. Asked her to send [place] a copy. 3. CAD family history: Normal stress test September 2017, other than hypertensive response    Medication Adjustments/Labs and Tests Ordered: Current medicines are reviewed at length with the patient today.  Concerns regarding medicines are outlined above.  Medication changes, Labs and Tests ordered today are listed in the Patient Instructions below. Patient Instructions  Dr Royann Shivers recommends that you schedule a follow-up appointment in 6 months. You will receive a reminder letter in the mail two months in advance. If you don't receive a letter, please call our office to schedule the follow-up appointment.  If you need a refill on your cardiac medications before your next appointment, please call your pharmacy.    Signed, Thurmon Fair, MD  07/20/2017 4:29 PM    [facility] [address] [place], [place], [place]  [address] Phone: [phone]; Fax: [phone]

## 2017-07-20 ENCOUNTER — Encounter: Payer: Self-pay | Admitting: Cardiovascular Disease

## 2017-10-16 ENCOUNTER — Other Ambulatory Visit: Payer: Self-pay | Admitting: Gynecology

## 2017-10-16 DIAGNOSIS — Z1231 Encounter for screening mammogram for malignant neoplasm of breast: Secondary | ICD-10-CM

## 2017-11-29 ENCOUNTER — Ambulatory Visit
Admission: RE | Admit: 2017-11-29 | Discharge: 2017-11-29 | Disposition: A | Discharge status: Home / Self Care | Payer: Medicare Other | Source: Ambulatory Visit | Attending: Gynecology | Admitting: Gynecology

## 2017-11-29 DIAGNOSIS — Z1231 Encounter for screening mammogram for malignant neoplasm of breast: Secondary | ICD-10-CM

## 2017-12-02 ENCOUNTER — Other Ambulatory Visit: Payer: Self-pay | Admitting: Gynecology

## 2017-12-02 DIAGNOSIS — R928 Other abnormal and inconclusive findings on diagnostic imaging of breast: Secondary | ICD-10-CM

## 2017-12-06 ENCOUNTER — Ambulatory Visit: Payer: Medicare Other

## 2017-12-06 ENCOUNTER — Ambulatory Visit
Admission: RE | Admit: 2017-12-06 | Discharge: 2017-12-06 | Disposition: A | Discharge status: Home / Self Care | Payer: Medicare Other | Source: Ambulatory Visit | Attending: Gynecology | Admitting: Gynecology

## 2017-12-06 DIAGNOSIS — R928 Other abnormal and inconclusive findings on diagnostic imaging of breast: Secondary | ICD-10-CM

## 2017-12-30 ENCOUNTER — Ambulatory Visit: Payer: Medicare Other | Admitting: Gynecology

## 2017-12-30 ENCOUNTER — Encounter: Payer: Self-pay | Admitting: Gynecology

## 2017-12-30 VITALS — BP 122/74 | Ht 60.0 in | Wt 128.0 lb

## 2017-12-30 DIAGNOSIS — N952 Postmenopausal atrophic vaginitis: Secondary | ICD-10-CM | POA: Diagnosis not present

## 2017-12-30 DIAGNOSIS — Z01411 Encounter for gynecological examination (general) (routine) with abnormal findings: Secondary | ICD-10-CM

## 2017-12-30 NOTE — Patient Instructions (Signed)
Follow-up in 1 year for annual exam, sooner if any issues. 

## 2017-12-30 NOTE — Progress Notes (Signed)
    Toni GravesBetty M Shaw Mar 19, 1951 [id number]        67 y.o.  G1P1 for annual gynecologic exam.  Doing well without gynecologic complaints  Past medical history,surgical history, problem list, medications, allergies, family history and social history were all reviewed and documented as reviewed in the EPIC chart.  ROS:  Performed with pertinent positives and negatives included in the history, assessment and plan.   Additional significant findings : None   Exam: Toni PortelaKim Shaw assistant Vitals:   12/30/17 0929  BP: 122/74  Weight: 128 lb (58.1 kg)  Height: 5' (1.524 m)   Body mass index is 25 kg/m.  General appearance:  Normal affect, orientation and appearance. Skin: Grossly normal HEENT: Without gross lesions.  No cervical or supraclavicular adenopathy. Thyroid normal.  Lungs:  Clear without wheezing, rales or rhonchi Cardiac: RR, without RMG Abdominal:  Soft, nontender, without masses, guarding, rebound, organomegaly or hernia Breasts:  Examined lying and sitting without masses, retractions, discharge or axillary adenopathy. Pelvic:  Ext, BUS, Vagina: With atrophic changes  Adnexa: Without masses or tenderness    Anus and perineum: Normal   Rectovaginal: Normal sphincter tone without palpated masses or tenderness.    Assessment/Plan:  67 y.o. G1P1 female for annual gynecologic exam status post TVH for leiomyoma age 67..   1. Postmenopausal/atrophic genital changes.  No significant hot flushes, night sweats or vaginal dryness. 2. Mammography 11/2017.  Continue with annual mammography when due.  Breast exam normal today. 3. Pap smear 11/2015.  No Pap smear done today.  No history of abnormal Pap smears.  Options to stop screening per current screening guidelines based on age and hysterectomy history reviewed.  Will readdress on an annual basis. 4. Colonoscopy last year.  Repeat at their recommended interval. 5. DEXA 2019.  Done through [facility].  Was told it was normal. 6. Health  maintenance.  No routine lab work done as patient does this elsewhere.  Follow-up 1 year, sooner as needed.   Toni Lordsimothy P Marlo Arriola MD, 9:48 AM 12/30/2017

## 2018-02-03 ENCOUNTER — Encounter: Payer: Self-pay | Admitting: Cardiovascular Disease

## 2018-02-03 ENCOUNTER — Ambulatory Visit: Payer: Medicare Other | Admitting: Cardiovascular Disease

## 2018-02-03 VITALS — BP 122/70 | HR 60 | Ht 60.0 in | Wt 130.0 lb

## 2018-02-03 DIAGNOSIS — Z9189 Other specified personal risk factors, not elsewhere classified: Secondary | ICD-10-CM | POA: Diagnosis not present

## 2018-02-03 DIAGNOSIS — E7800 Pure hypercholesterolemia, unspecified: Secondary | ICD-10-CM

## 2018-02-03 DIAGNOSIS — I10 Essential (primary) hypertension: Secondary | ICD-10-CM

## 2018-02-03 NOTE — Progress Notes (Signed)
Cardiology Office Note    Date:  02/05/2018   ID:  Toni MarchBetty M Shaw, DOB 27-Aug-1951, MRN [id number]  [name], [name], MD  Cardiologist:   Toni FairMihai Liliahna Cudd, MD   Chief complaint: Hypertension and hyperlipidemia follow-up   History of Present Illness:  Toni Shaw is a 67 y.o. female with hypertension and hyperlipidemia, returning for routine follow-up.  She continues to take care of her grandmother and her great aunt who are in their late 5590s.  This causes her some emotional turmoil, but overall she is feeling quite well.  The patient specifically denies any chest pain at rest or with exertion, dyspnea at rest or with exertion, orthopnea, paroxysmal nocturnal dyspnea, syncope, palpitations, focal neurological deficits, intermittent claudication, lower extremity edema, unexplained weight gain, cough, hemoptysis or wheezing.  The patient also denies abdominal pain, nausea, vomiting, dysphagia, diarrhea, constipation, polyuria, polydipsia, dysuria, hematuria, frequency, urgency, abnormal bleeding or bruising, fever, chills, unexpected weight changes, mood swings, change in skin or hair texture, change in voice quality, auditory or visual problems, allergic reactions or rashes, new musculoskeletal complaints other than usual "aches and pains".  About 3 years ago she did have syncope after blood donation, but none since.  Maintaining BMI around 25 and exercising most days of the week.  Cholesterol profile performed last November showed favorable findings.  Past Medical History:  Diagnosis Date  . Elevated cholesterol   . Hypertension     Past Surgical History:  Procedure Laterality Date  . ABDOMINAL HYSTERECTOMY  age 67   leiomyomata  . BREAST SURGERY     Reduction    Current Medications: Outpatient Medications Prior to Visit  Medication Sig Dispense Refill  . aspirin 81 MG tablet Take 81 mg by mouth daily.    Marland Kitchen. atenolol-chlorthalidone (TENORETIC) 50-25 MG per tablet Take 1  tablet by mouth daily.    Marland Kitchen. atorvastatin (LIPITOR) 20 MG tablet Take 20 mg by mouth daily.    . Cholecalciferol (VITAMIN D PO) Take 1,000 mg by mouth daily.     Marland Kitchen. ketoconazole (NIZORAL) 2 % cream Apply 1 application topically daily. 2-3 times daily  0  . Multiple Vitamin (MULTIVITAMIN) tablet Take 1 tablet by mouth daily.    . Omega-3 Fatty Acids (FISH OIL PO) Take by mouth.    . travoprost, benzalkonium, (TRAVATAN) 0.004 % ophthalmic solution 1 drop at bedtime.     No facility-administered medications prior to visit.      Allergies:   Patient has no known allergies.   Social History   Socioeconomic History  . Marital status: Married    Spouse name: Not on file  . Number of children: Not on file  . Years of education: Not on file  . Highest education level: Not on file  Occupational History  . Not on file  Social Needs  . Financial resource strain: Not on file  . Food insecurity:    Worry: Not on file    Inability: Not on file  . Transportation needs:    Medical: Not on file    Non-medical: Not on file  Tobacco Use  . Smoking status: Never Smoker  . Smokeless tobacco: Never Used  Substance and Sexual Activity  . Alcohol use: No    Alcohol/week: 0.0 oz  . Drug use: No  . Sexual activity: Yes    Birth control/protection: Surgical    Comment: HYST-1st intercourse 67 yo.--Fewer than 5 partners  Lifestyle  . Physical activity:    Days per  week: Not on file    Minutes per session: Not on file  . Stress: Not on file  Relationships  . Social connections:    Talks on phone: Not on file    Gets together: Not on file    Attends religious service: Not on file    Active member of club or organization: Not on file    Attends meetings of clubs or organizations: Not on file    Relationship status: Not on file  Other Topics Concern  . Not on file  Social History Narrative  . Not on file     Family History:  The patient's family history includes Breast cancer (age of onset:  45) in her mother; Cancer in her brother; Diabetes in her brother; Heart disease in her maternal grandfather and maternal grandmother.   ROS:   Please see the history of present illness.    ROS All other systems reviewed and are negative.   PHYSICAL EXAM:   VS:  BP 122/70   Pulse 60   Ht 5' (1.524 m)   Wt 130 lb (59 kg)   BMI 25.39 kg/m     General: Alert, oriented x3, no distress, appears fit Head: no evidence of trauma, PERRL, EOMI, no exophtalmos or lid lag, no myxedema, no xanthelasma; normal ears, nose and oropharynx Neck: normal jugular venous pulsations and no hepatojugular reflux; brisk carotid pulses without delay and no carotid bruits Chest: clear to auscultation, no signs of consolidation by percussion or palpation, normal fremitus, symmetrical and full respiratory excursions Cardiovascular: normal position and quality of the apical impulse, regular rhythm, normal first and second heart sounds, no murmurs, rubs or gallops Abdomen: no tenderness or distention, no masses by palpation, no abnormal pulsatility or arterial bruits, normal bowel sounds, no hepatosplenomegaly Extremities: no clubbing, cyanosis or edema; 2+ radial, ulnar and brachial pulses bilaterally; 2+ right femoral, posterior tibial and dorsalis pedis pulses; 2+ left femoral, posterior tibial and dorsalis pedis pulses; no subclavian or femoral bruits Neurological: grossly nonfocal Psych: Normal mood and affect  appears fit   Wt Readings from Last 3 Encounters:  02/03/18 130 lb (59 kg)  12/30/17 128 lb (58.1 kg)  07/19/17 128 lb (58.1 kg)      Studies/Labs Reviewed:   EKG:  EKG is not ordered today.  The ekg ordered in September showed normal sinus rhythm with very mild first-degree AV block.  Recent Labs on 07/01/2015 Glucose 83, BUN 14, creatinine 0.88, potassium 4.2, normal liver function tests  Lipid Panel Total cholesterol 170, triglycerides 157, HDL 33, LDL 100  ASSESSMENT:    1. Essential  hypertension   2. Elevated cholesterol   3. At risk for coronary artery disease      PLAN:  In order of problems listed above:  1. HTN: good control 2. HLP: Checked yearly by Dr. Clelia Croft, most recently November 2018. On statin 3. CAD family history: Normal stress test September 2017, other than hypertensive response    Medication Adjustments/Labs and Tests Ordered: Current medicines are reviewed at length with the patient today.  Concerns regarding medicines are outlined above.  Medication changes, Labs and Tests ordered today are listed in the Patient Instructions below. Patient Instructions  Dr Royann Shivers recommends that you schedule a follow-up appointment in 12 months. You will receive a reminder letter in the mail two months in advance. If you don't receive a letter, please call our office to schedule the follow-up appointment.  If you need a refill on your cardiac  medications before your next appointment, please call your pharmacy.    Signed, Toni Fair, MD  02/05/2018 1:13 PM    [facility] [address] [place], [place], [place]  [address] Phone: [phone]; Fax: [phone]

## 2018-02-03 NOTE — Patient Instructions (Signed)
Dr Croitoru recommends that you schedule a follow-up appointment in 12 months. You will receive a reminder letter in the mail two months in advance. If you don't receive a letter, please call our office to schedule the follow-up appointment.  If you need a refill on your cardiac medications before your next appointment, please call your pharmacy. 

## 2018-11-10 ENCOUNTER — Other Ambulatory Visit: Payer: Self-pay | Admitting: Gynecology

## 2018-11-10 DIAGNOSIS — Z1231 Encounter for screening mammogram for malignant neoplasm of breast: Secondary | ICD-10-CM

## 2018-12-09 ENCOUNTER — Ambulatory Visit
Admission: RE | Admit: 2018-12-09 | Discharge: 2018-12-09 | Disposition: A | Discharge status: Home / Self Care | Payer: Medicare Other | Source: Ambulatory Visit | Attending: Gynecology | Admitting: Gynecology

## 2018-12-09 DIAGNOSIS — Z1231 Encounter for screening mammogram for malignant neoplasm of breast: Secondary | ICD-10-CM

## 2019-01-01 ENCOUNTER — Encounter: Payer: Medicare Other | Admitting: Gynecology

## 2019-02-13 ENCOUNTER — Other Ambulatory Visit: Payer: Self-pay

## 2019-02-17 ENCOUNTER — Ambulatory Visit (INDEPENDENT_AMBULATORY_CARE_PROVIDER_SITE_OTHER): Payer: Medicare Other | Admitting: Gynecology

## 2019-02-17 ENCOUNTER — Encounter: Payer: Self-pay | Admitting: Gynecology

## 2019-02-17 ENCOUNTER — Other Ambulatory Visit: Payer: Self-pay

## 2019-02-17 VITALS — BP 124/78 | Ht 60.0 in | Wt 130.0 lb

## 2019-02-17 DIAGNOSIS — Z01419 Encounter for gynecological examination (general) (routine) without abnormal findings: Secondary | ICD-10-CM | POA: Diagnosis not present

## 2019-02-17 DIAGNOSIS — N952 Postmenopausal atrophic vaginitis: Secondary | ICD-10-CM | POA: Diagnosis not present

## 2019-02-17 NOTE — Progress Notes (Signed)
    Toni Shaw 1951/02/14 [id number]        68 y.o.  G1P1 for breast and pelvic exam.  Without complaints  Past medical history,surgical history, problem list, medications, allergies, family history and social history were all reviewed and documented as reviewed in the EPIC chart.  ROS:  Performed with pertinent positives and negatives included in the history, assessment and plan.   Additional significant findings : None   Exam: [name] assistant Vitals:   02/17/19 1134  BP: 124/78  Weight: 130 lb (59 kg)  Height: 5' (1.524 m)   Body mass index is 25.39 kg/m.  General appearance:  Normal affect, orientation and appearance. Skin: Grossly normal HEENT: Without gross lesions.  No cervical or supraclavicular adenopathy. Thyroid normal.  Lungs:  Clear without wheezing, rales or rhonchi Cardiac: RR, without RMG Abdominal:  Soft, nontender, without masses, guarding, rebound, organomegaly or hernia Breasts:  Examined lying and sitting without masses, retractions, discharge or axillary adenopathy. Pelvic:  Ext, BUS, Vagina: With atrophic changes  Adnexa: Without masses or tenderness    Anus and perineum: Normal   Rectovaginal: Normal sphincter tone without palpated masses or tenderness.    Assessment/Plan:  68 y.o. G1P1 female for breast and pelvic exam.  Status post TVH for leiomyoma age 41  1. Postmenopausal/atrophic genital changes.  Without significant menopausal symptoms. 2. Mammography 11/2018.  Continue with annual mammography next year when due.  Breast exam normal today. 3. Colonoscopy 2018.  Repeat at their recommended interval. 4. DEXA at [facility] reported normal 2019.  Repeat at their recommended interval. 5. Pap smear 2017.  Pap smear of vaginal cuff today.  No history of abnormal Pap smears previously.  Options to stop screening per current screening guidelines based on age and hysterectomy reviewed.  Will readdress on an annual basis. 6. Health maintenance.  No  routine lab work done as patient does this elsewhere.  Follow-up 1 year, sooner as needed.   Dara Lords MD, 11:55 AM 02/17/2019

## 2019-02-17 NOTE — Patient Instructions (Signed)
Follow-up in 1 year for annual exam 

## 2019-02-17 NOTE — Addendum Note (Signed)
Addended by: Dayna Barker on: 02/17/2019 12:31 PM   Modules accepted: Orders

## 2019-02-18 LAB — PAP IG W/ RFLX HPV ASCU

## 2019-03-04 ENCOUNTER — Telehealth: Payer: Self-pay | Admitting: Cardiovascular Disease

## 2019-03-06 ENCOUNTER — Telehealth (INDEPENDENT_AMBULATORY_CARE_PROVIDER_SITE_OTHER): Payer: Medicare Other | Admitting: Cardiovascular Disease

## 2019-03-06 ENCOUNTER — Encounter: Payer: Self-pay | Admitting: Cardiovascular Disease

## 2019-03-06 VITALS — BP 122/71 | HR 75 | Ht 60.0 in | Wt 130.0 lb

## 2019-03-06 DIAGNOSIS — E7800 Pure hypercholesterolemia, unspecified: Secondary | ICD-10-CM | POA: Diagnosis not present

## 2019-03-06 DIAGNOSIS — I10 Essential (primary) hypertension: Secondary | ICD-10-CM | POA: Diagnosis not present

## 2019-03-06 NOTE — Patient Instructions (Signed)
Medication Instructions:  Your physician recommends that you continue on your current medications as directed. Please refer to the Current Medication list given to you today.  If you need a refill on your cardiac medications before your next appointment, please call your pharmacy.   Lab work: None ordered If you have labs (blood work) drawn today and your tests are completely normal, you will receive your results only by: . MyChart Message (if you have MyChart) OR . A paper copy in the mail If you have any lab test that is abnormal or we need to change your treatment, we will call you to review the results.  Testing/Procedures: None ordered  Follow-Up: At [facility], you and your health needs are our priority.  As part of our continuing mission to provide you with exceptional heart care, we have created designated Provider Care Teams.  These Care Teams include your primary Cardiologist (physician) and Advanced Practice Providers (APPs -  Physician Assistants and Nurse Practitioners) who all work together to provide you with the care you need, when you need it. You will need a follow up appointment in 12 months.  Please call our office 2 months in advance to schedule this appointment.  You may see Dr.Croitoru or one of the following Advanced Practice Providers on your designated Care Team: Hao Meng, PA-C . Angela Duke, PA-C   

## 2019-03-06 NOTE — Progress Notes (Signed)
Virtual Visit via Telephone Note   This visit type was conducted due to national recommendations for restrictions regarding the COVID-19 Pandemic (e.g. social distancing) in an effort to limit this patient's exposure and mitigate transmission in our community.  Due to her co-morbid illnesses, this patient is at least at moderate risk for complications without adequate follow up.  This format is felt to be most appropriate for this patient at this time.  The patient did not have access to video technology/had technical difficulties with video requiring transitioning to audio format only (telephone).  All issues noted in this document were discussed and addressed.  No physical exam could be performed with this format.  Please refer to the patient's chart for her  consent to telehealth for [facility].   Unable to perform a video visit due to lack of necessary equipment.  Date:  03/06/2019   ID:  Toni Shaw, DOB 1951-10-16, MRN [id number]  Patient Location: Home Provider Location: Home  PCP:  Lupita Raider, MD  Cardiologist:  Annikah Lovins Electrophysiologist:  None   Evaluation Performed:  Follow-Up Visit  Chief Complaint:  HTN, HLP  History of Present Illness:    Toni Shaw is a 68 y.o. female with hypertension and dyslipidemia (high LDL, low HDL, first-degree AV block.  She has not had any challenges, but her husband of 50 years passed away in 12/10/22.  Her son and his wife have moved into helper with the life-changing events.  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  The patient specifically denies any chest pain at rest exertion, dyspnea at rest or with exertion, orthopnea, paroxysmal nocturnal dyspnea, syncope, palpitations, focal neurological deficits, intermittent claudication, lower extremity edema, unexplained weight gain, cough, hemoptysis or wheezing.  She has had some problems with depressed  mood, but this is probably natural so soon after her loss.  She has been exercising less, but is thinking of starting some video assisted exercises in the home.  She has not gained any weight despite this.  Her most recent lipid profile from February is essentially unchanged with an HDL of 36 and LDL of 102.  The patient does not have symptoms concerning for COVID-19 infection (fever, chills, cough, or new shortness of breath).    Past Medical History:  Diagnosis Date  . Elevated cholesterol   . Hypertension    Past Surgical History:  Procedure Laterality Date  . ABDOMINAL HYSTERECTOMY  age 3   leiomyomata  . BREAST SURGERY     Reduction     Current Meds  Medication Sig  . aspirin 81 MG tablet Take 81 mg by mouth daily.  Marland Kitchen atenolol-chlorthalidone (TENORETIC) 50-25 MG per tablet Take 1 tablet by mouth daily.  Marland Kitchen atorvastatin (LIPITOR) 20 MG tablet Take 20 mg by mouth daily.  . Cholecalciferol (VITAMIN D PO) Take 1,000 mg by mouth daily.   Marland Kitchen ketoconazole (NIZORAL) 2 % cream Apply 1 application topically daily. 2-3 times daily  . Multiple Vitamin (MULTIVITAMIN) tablet Take 1 tablet by mouth daily.  . Omega-3 Fatty Acids (FISH OIL PO) Take by mouth.  . travoprost, benzalkonium, (TRAVATAN) 0.004 % ophthalmic solution 1 drop at bedtime.     Allergies:   Patient has no known allergies.   Social History   Tobacco Use  . Smoking status: Never Smoker  . Smokeless tobacco: Never Used  Substance Use Topics  . Alcohol use: No  Alcohol/week: 0.0 standard drinks  . Drug use: No     Family Hx: The patient's family history includes Breast cancer (age of onset: 1274) in her mother; Cancer in her brother; Diabetes in her brother; Heart disease in her maternal grandfather and maternal grandmother.  ROS:   Please see the history of present illness.     All other systems reviewed and are negative.   Prior CV studies:   The following studies were reviewed today:  Labs from [facility] December 04, 2018  Labs/Other Tests and Data Reviewed:    EKG:  An ECG dated July 19, 2017 was personally reviewed today and demonstrated:  Sinus bradycardia with first-degree AV block (PR 260 ms), no repolarization abnormalities, QTC 402 ms  Recent Labs: No results found for requested labs within last 8760 hours.   Recent Lipid Panel No results found for: CHOL, TRIG, HDL, CHOLHDL, LDLCALC, LDLDIRECT  Wt Readings from Last 3 Encounters:  03/06/19 130 lb (59 kg)  02/17/19 130 lb (59 kg)  02/03/18 130 lb (59 kg)     Objective:    Vital Signs:  BP 122/71   Pulse 75   Ht 5' (1.524 m)   Wt 130 lb (59 kg)   BMI 25.39 kg/m    VITAL SIGNS:  reviewed Unable to examine  ASSESSMENT & PLAN:    1. HTN: Well-controlled 2. HLP: HDL improved from last year, but remains less than desirable.  Encourage her and her plans to restart exercise.  She is essentially at ideal weight, within a couple of pounds. 3. Grief: Appropriate after her loss, compounded by current coronavirus pandemic, but she has a lot of support and seems to be coping reasonably well.  COVID-19 Education: The signs and symptoms of COVID-19 were discussed with the patient and how to seek care for testing (follow up with PCP or arrange E-visit).  The importance of social distancing was discussed today.  Time:   Today, I have spent 15 minutes with the patient with telehealth technology discussing the above problems.     Medication Adjustments/Labs and Tests Ordered: Current medicines are reviewed at length with the patient today.  Concerns regarding medicines are outlined above.   Tests Ordered: No orders of the defined types were placed in this encounter.   Medication Changes: No orders of the defined types were placed in this encounter.   Disposition:  Follow up 12 months  Signed, Thurmon FairMihai Jesiah Grismer, MD  03/06/2019 10:44 AM    [facility]

## 2019-04-28 NOTE — Telephone Encounter (Signed)
Opened in error

## 2019-08-05 ENCOUNTER — Encounter: Payer: Self-pay | Admitting: Gynecology

## 2019-10-27 ENCOUNTER — Other Ambulatory Visit: Payer: Self-pay | Admitting: Family Medicine

## 2019-10-27 DIAGNOSIS — Z1231 Encounter for screening mammogram for malignant neoplasm of breast: Secondary | ICD-10-CM

## 2019-11-23 ENCOUNTER — Ambulatory Visit: Payer: Medicare Other | Attending: Internal Medicine

## 2019-11-23 DIAGNOSIS — Z23 Encounter for immunization: Secondary | ICD-10-CM | POA: Insufficient documentation

## 2019-11-23 NOTE — Progress Notes (Signed)
   Covid-19 Vaccination Clinic  Name:  Toni Shaw    MRN: [id number] DOB: Sep 28, 1951  11/23/2019  [name] was observed post Covid-19 immunization for 15 minutes without incidence. She was provided with Vaccine Information Sheet and instruction to access the V-Safe system.   [name] was instructed to call 911 with any severe reactions post vaccine: Marland Kitchen Difficulty breathing  . Swelling of your face and throat  . A fast heartbeat  . A bad rash all over your body  . Dizziness and weakness    Immunizations Administered    Name Date Dose VIS Date Route   Pfizer COVID-19 Vaccine 11/23/2019  9:23 AM 0.3 mL 10/09/2019 Intramuscular   Manufacturer: [employer], [employer]   Lot: [id number]   NDC: [address]-1

## 2019-12-14 ENCOUNTER — Other Ambulatory Visit: Payer: Self-pay

## 2019-12-14 ENCOUNTER — Ambulatory Visit
Admission: RE | Admit: 2019-12-14 | Discharge: 2019-12-14 | Disposition: A | Discharge status: Home / Self Care | Payer: Medicare Other | Source: Ambulatory Visit | Attending: Family Medicine | Admitting: Family Medicine

## 2019-12-14 DIAGNOSIS — Z1231 Encounter for screening mammogram for malignant neoplasm of breast: Secondary | ICD-10-CM

## 2019-12-15 ENCOUNTER — Ambulatory Visit: Payer: Medicare Other

## 2019-12-18 ENCOUNTER — Ambulatory Visit: Payer: Medicare Other

## 2019-12-20 ENCOUNTER — Ambulatory Visit: Payer: Medicare Other | Attending: Internal Medicine

## 2019-12-20 DIAGNOSIS — Z23 Encounter for immunization: Secondary | ICD-10-CM | POA: Insufficient documentation

## 2019-12-20 NOTE — Progress Notes (Signed)
   Covid-19 Vaccination Clinic  Name:  Toni Shaw    MRN: [id number] DOB: 1951/06/13  12/20/2019  Toni Shaw was observed post Covid-19 immunization for 15 minutes without incidence. She was provided with Vaccine Information Sheet and instruction to access the V-Safe system.   Toni Shaw was instructed to call 911 with any severe reactions post vaccine: Marland Kitchen Difficulty breathing  . Swelling of your face and throat  . A fast heartbeat  . A bad rash all over your body  . Dizziness and weakness    Immunizations Administered    Name Date Dose VIS Date Route   Pfizer COVID-19 Vaccine 12/20/2019  8:29 AM 0.3 mL 10/09/2019 Intramuscular   Manufacturer: [employer], [employer]   Lot: [id number]   NDC: [address]-0

## 2020-02-18 ENCOUNTER — Encounter: Payer: Medicare Other | Admitting: Obstetrics and Gynecology

## 2020-02-24 ENCOUNTER — Other Ambulatory Visit: Payer: Self-pay

## 2020-02-25 ENCOUNTER — Ambulatory Visit: Payer: Medicare Other | Admitting: Obstetrics and Gynecology

## 2020-02-25 ENCOUNTER — Encounter: Payer: Self-pay | Admitting: Obstetrics and Gynecology

## 2020-02-25 VITALS — BP 120/74 | Ht 60.0 in | Wt 128.0 lb

## 2020-02-25 DIAGNOSIS — Z01419 Encounter for gynecological examination (general) (routine) without abnormal findings: Secondary | ICD-10-CM | POA: Diagnosis not present

## 2020-02-25 NOTE — Progress Notes (Signed)
MARIBELLE HOPPLE 07-Aug-1951 [id number]  SUBJECTIVE:  69 y.o. G1P1 female here for an annual routine gynecologic exam. She has no gynecologic concerns.  She lost her husband last year and has been feeling little down at times but overall has been doing all right.  Current Outpatient Medications  Medication Sig Dispense Refill  . aspirin 81 MG tablet Take 81 mg by mouth daily.    Marland Kitchen atenolol-chlorthalidone (TENORETIC) 50-25 MG per tablet Take 1 tablet by mouth daily.    Marland Kitchen atorvastatin (LIPITOR) 20 MG tablet Take 20 mg by mouth daily.    . Cholecalciferol (VITAMIN D PO) Take 1,000 mg by mouth daily.     Marland Kitchen ketoconazole (NIZORAL) 2 % cream Apply 1 application topically daily. 2-3 times daily  0  . Multiple Vitamin (MULTIVITAMIN) tablet Take 1 tablet by mouth daily.    . Omega-3 Fatty Acids (FISH OIL PO) Take by mouth.    . travoprost, benzalkonium, (TRAVATAN) 0.004 % ophthalmic solution 1 drop at bedtime.     No current facility-administered medications for this visit.   Allergies: Patient has no known allergies.  No LMP recorded. Patient has had a hysterectomy.  Past medical history,surgical history, problem list, medications, allergies, family history and social history were all reviewed and documented as reviewed in the EPIC chart.  ROS:  Feeling well. No dyspnea or chest pain on exertion.  No abdominal pain, change in bowel habits, black or bloody stools.  No urinary tract symptoms. GYN ROS: no abnormal bleeding, pelvic pain or discharge, no breast pain or new or enlarging lumps on self exam. No neurological complaints.   OBJECTIVE:  BP 120/74   Ht 5' (1.524 m)   Wt 128 lb (58.1 kg)   BMI 25.00 kg/m  The patient appears well, alert, oriented x 3, in no distress. ENT normal.  Neck supple. No cervical or supraclavicular adenopathy or thyromegaly.  Lungs are clear, good air entry, no wheezes, rhonchi or rales. S1 and S2 normal, no murmurs, regular rate and rhythm.  Abdomen soft without  tenderness, guarding, mass or organomegaly.  Neurological is normal, no focal findings.  BREAST EXAM: breasts appear normal, no suspicious masses, no skin or nipple changes or axillary nodes  PELVIC EXAM: VULVA: normal appearing vulva with no masses, tenderness or lesions, VAGINA: normal appearing vagina with normal color and discharge, no lesions, CERVIX: surgically absent, UTERUS: surgically absent, vaginal cuff normal, ADNEXA: no masses  Chaperone: [name] present during the examination  ASSESSMENT:  69 y.o. G1P1 here for annual gynecologic exam  PLAN:   1. Postmenopausal. Prior TVH for leiomyoma at age 57.  Ovaries were retained.  No hot flashes or vaginal bleeding.  She will let us know if she is having any trouble with depression.  Sounds like she has a good support group with family and friends and she stays busy taking care of her 57 year old grandmother with her sister. 2. Pap smear 01/2019 of vaginal cuff.  No significant history of abnormal Pap smears.  Next Pap smear due 2023 following the current guidelines recommending the 3 year interval, will address if she does desire screening at that time.   3. Mammogram 11/2019.  Normal breast exam today.  Continue annual mammogram this year when due. 4. Colonoscopy 2018.  Recommended that she follow up at the recommended interval.   5. DEXA 11/2017 with [facility].  She will continue to follow with them in regards to her bone health.  6. Health maintenance.  No  labs today as she normally has these completed elsewhere.  Return annually or sooner, prn.  Joseph Pierini MD 02/25/20

## 2020-04-29 ENCOUNTER — Encounter: Payer: Self-pay | Admitting: Cardiovascular Disease

## 2020-04-29 ENCOUNTER — Other Ambulatory Visit: Payer: Self-pay

## 2020-04-29 ENCOUNTER — Ambulatory Visit: Payer: Medicare Other | Admitting: Cardiovascular Disease

## 2020-04-29 VITALS — BP 122/80 | HR 69 | Ht 59.0 in | Wt 129.0 lb

## 2020-04-29 DIAGNOSIS — I10 Essential (primary) hypertension: Secondary | ICD-10-CM | POA: Diagnosis not present

## 2020-04-29 DIAGNOSIS — E785 Hyperlipidemia, unspecified: Secondary | ICD-10-CM | POA: Diagnosis not present

## 2020-04-29 NOTE — Patient Instructions (Signed)

## 2020-04-29 NOTE — Progress Notes (Signed)
Cardiology Office Note   Date:  05/02/2020   ID:  Toni Shaw, DOB 10-Apr-1951, MRN [id number]  PCP:  Lupita Raider, MD  Cardiologist:  Francenia Chimenti Electrophysiologist:  None   Evaluation Performed:  Follow-Up Visit  Chief Complaint:  HTN, HLP  History of Present Illness:    Toni Shaw is a 69 y.o. female with hypertension and dyslipidemia (high LDL, low HDL, first-degree AV block.  Her husband and great aunt have both passed in the last several months. She has moved her 46 year old grandmother in with her (who is blind and has disturbed sleep-wake cycles). She is tired, but holding out OK.  The patient specifically denies any chest pain at rest exertion, dyspnea at rest or with exertion, orthopnea, paroxysmal nocturnal dyspnea, syncope, palpitations, focal neurological deficits, intermittent claudication, lower extremity edema, unexplained weight gain, cough, hemoptysis or wheezing.  She tries to exercise. Her most recent lipid profile from February is slightly better than the previous year, with an HDL of 37 and LDL of 96. BP is always in good range.   Past Medical History:  Diagnosis Date  . Elevated cholesterol   . Hypertension    Past Surgical History:  Procedure Laterality Date  . ABDOMINAL HYSTERECTOMY  age 39   leiomyomata  . BREAST SURGERY     Reduction     Current Meds  Medication Sig  . aspirin 81 MG tablet Take 81 mg by mouth daily.  Marland Kitchen atenolol-chlorthalidone (TENORETIC) 50-25 MG per tablet Take 1 tablet by mouth daily.  Marland Kitchen atorvastatin (LIPITOR) 20 MG tablet Take 20 mg by mouth daily.  . Cholecalciferol (VITAMIN D PO) Take 1,000 mg by mouth daily.   Marland Kitchen ketoconazole (NIZORAL) 2 % cream Apply 1 application topically daily. 2-3 times daily  . Multiple Vitamin (MULTIVITAMIN) tablet Take 1 tablet by mouth daily.  . Omega-3 Fatty Acids (FISH OIL PO) Take by mouth.  . travoprost, benzalkonium, (TRAVATAN) 0.004 % ophthalmic solution 1 drop at bedtime.      Allergies:   Patient has no known allergies.   Social History   Tobacco Use  . Smoking status: Never Smoker  . Smokeless tobacco: Never Used  Vaping Use  . Vaping Use: Never used  Substance Use Topics  . Alcohol use: No    Alcohol/week: 0.0 standard drinks  . Drug use: No     Family Hx: The patient's family history includes Breast cancer (age of onset: 56) in her mother; Cancer in her brother; Diabetes in her brother; Heart disease in her maternal grandfather and maternal grandmother.  ROS:   Please see the history of present illness.    All other systems are reviewed and are negative.   Prior CV studies:   The following studies were reviewed today:  Labs from [facility] December 09, 2019 (below)  Labs/Other Tests and Data Reviewed:    EKG:  Ordered today shows sinus rhythm with long PR 232 ms, no ischemic changes, QTc 403 ms (unchanged)  Recent Labs: No results found for requested labs within last 8760 hours.   Recent Lipid Panel No results found for: CHOL, TRIG, HDL, CHOLHDL, LDLCALC, LDLDIRECT  12/09/2019 chol 157, hdl, 37, ldl 96, tg 140 Creat 0.97, K 4.1  Wt Readings from Last 3 Encounters:  04/29/20 129 lb (58.5 kg)  02/25/20 128 lb (58.1 kg)  03/06/19 130 lb (59 kg)     Objective:    Vital Signs:  BP 122/80   Pulse 69  Ht 4\' 11"  (1.499 m)   Wt 129 lb (58.5 kg)   PF 98 L/min   BMI 26.05 kg/m     General: Alert, oriented x3, no distress, appears fit, younger than stated age Head: no evidence of trauma, PERRL, EOMI, no exophtalmos or lid lag, no myxedema, no xanthelasma; normal ears, nose and oropharynx Neck: normal jugular venous pulsations and no hepatojugular reflux; brisk carotid pulses without delay and no carotid bruits Chest: clear to auscultation, no signs of consolidation by percussion or palpation, normal fremitus, symmetrical and full respiratory excursions Cardiovascular: normal position and quality of the apical impulse, regular  rhythm, normal first and second heart sounds, no murmurs, rubs or gallops Abdomen: no tenderness or distention, no masses by palpation, no abnormal pulsatility or arterial bruits, normal bowel sounds, no hepatosplenomegaly Extremities: no clubbing, cyanosis or edema; 2+ radial, ulnar and brachial pulses bilaterally; 2+ right femoral, posterior tibial and dorsalis pedis pulses; 2+ left femoral, posterior tibial and dorsalis pedis pulses; no subclavian or femoral bruits Neurological: grossly nonfocal Psych: Normal mood and affect   ASSESSMENT & PLAN:    1. HTN: excellent control 2. HLP: HDL still low, just a point better, LDL now just at target < 100. No change in meds. Encourage more walking, maybe light weights.   COVID-19 Education: The signs and symptoms of COVID-19 were discussed with the patient and how to seek care for testing (follow up with PCP or arrange E-visit).  The importance of social distancing was discussed today.  Time:   Today, I have spent 15 minutes with the patient with telehealth technology discussing the above problems.     Medication Adjustments/Labs and Tests Ordered: Current medicines are reviewed at length with the patient today.  Concerns regarding medicines are outlined above.   Tests Ordered: Orders Placed This Encounter  Procedures  . EKG 12-Lead    Medication Changes: No orders of the defined types were placed in this encounter.  Patient Instructions  Medication Instructions:  No changes *If you need a refill on your cardiac medications before your next appointment, please call your pharmacy*   Lab Work: None ordered If you have labs (blood work) drawn today and your tests are completely normal, you will receive your results only by: MyChart Message (if you have MyChart) OR . A paper copy in the mail If you have any lab test that is abnormal or we need to change your treatment, we will call you to review the  results.   Testing/Procedures: None ordered   Follow-Up: At [facility], you and your health needs are our priority.  As part of our continuing mission to provide you with exceptional heart care, we have created designated Provider Care Teams.  These Care Teams include your primary Cardiologist (physician) and Advanced Practice Providers (APPs -  Physician Assistants and Nurse Practitioners) who all work together to provide you with the care you need, when you need it.  We recommend signing up for the patient portal called "MyChart".  Sign up information is provided on this After Visit Summary.  MyChart is used to connect with patients for Virtual Visits (Telemedicine).  Patients are able to view lab/test results, encounter notes, upcoming appointments, etc.  Non-urgent messages can be sent to your provider as well.   To learn more about what you can do with MyChart, go to [facility].    Your next appointment:   12 month(s)  The format for your next appointment:   In Person  Provider:   You may see Thurmon Fair, MD or one of the following Advanced Practice Providers on your designated Care Team:    Azalee Course, PA-C  Micah Flesher, PA-C or   Judy Pimple, [place]     Disposition:  Follow up 12 months  Signed, Thurmon Fair, MD  05/02/2020 12:11 PM    [facility]

## 2020-08-27 ENCOUNTER — Ambulatory Visit: Payer: Medicare Other | Attending: Internal Medicine

## 2020-08-27 DIAGNOSIS — Z23 Encounter for immunization: Secondary | ICD-10-CM

## 2020-08-27 NOTE — Progress Notes (Signed)
   Covid-19 Vaccination Clinic  Name:  ERSA DELANEY    MRN: [id number] DOB: May 20, 1951  08/27/2020  Ms. Schaber was observed post Covid-19 immunization for 15 minutes without incident. She was provided with Vaccine Information Sheet and instruction to access the V-Safe system.   Ms. Jaimes was instructed to call 911 with any severe reactions post vaccine: Marland Kitchen Difficulty breathing  . Swelling of face and throat  . A fast heartbeat  . A bad rash all over body  . Dizziness and weakness

## 2020-11-01 ENCOUNTER — Other Ambulatory Visit: Payer: Self-pay | Admitting: Family Medicine

## 2020-11-01 DIAGNOSIS — Z1231 Encounter for screening mammogram for malignant neoplasm of breast: Secondary | ICD-10-CM

## 2020-12-14 ENCOUNTER — Other Ambulatory Visit: Payer: Self-pay

## 2020-12-14 ENCOUNTER — Ambulatory Visit
Admission: RE | Admit: 2020-12-14 | Discharge: 2020-12-14 | Disposition: A | Discharge status: Home / Self Care | Payer: Medicare Other | Source: Ambulatory Visit | Attending: Family Medicine | Admitting: Family Medicine

## 2020-12-14 DIAGNOSIS — Z1231 Encounter for screening mammogram for malignant neoplasm of breast: Secondary | ICD-10-CM

## 2021-02-23 ENCOUNTER — Encounter: Payer: Self-pay | Admitting: Nurse Practitioner

## 2021-02-27 ENCOUNTER — Encounter: Payer: Medicare Other | Admitting: Obstetrics and Gynecology

## 2021-02-27 ENCOUNTER — Encounter: Payer: Self-pay | Admitting: Nurse Practitioner

## 2021-02-27 ENCOUNTER — Other Ambulatory Visit: Payer: Self-pay

## 2021-02-27 ENCOUNTER — Ambulatory Visit (INDEPENDENT_AMBULATORY_CARE_PROVIDER_SITE_OTHER): Payer: Medicare Other | Admitting: Nurse Practitioner

## 2021-02-27 VITALS — BP 118/74 | Ht 60.0 in | Wt 125.0 lb

## 2021-02-27 DIAGNOSIS — Z90710 Acquired absence of both cervix and uterus: Secondary | ICD-10-CM

## 2021-02-27 DIAGNOSIS — Z01419 Encounter for gynecological examination (general) (routine) without abnormal findings: Secondary | ICD-10-CM

## 2021-02-27 DIAGNOSIS — Z780 Asymptomatic menopausal state: Secondary | ICD-10-CM

## 2021-02-27 NOTE — Progress Notes (Signed)
   KURSTEN KRUK 1951/01/23 [id number]   History:  70 y.o. G1P1 presents for breast and pelvic exam without GYN complaints. TVH at age 32 for fibroids, no HRT. Normal pap and mammogram history. Caring for 71 yo grandmother.   Gynecologic History No LMP recorded. Patient has had a hysterectomy.   Contraception: status post hysterectomy  Health Maintenance Last Pap: No longer screening per guidelines Last mammogram: 12/14/2020. Results were: normal Last colonoscopy: 2018. Results were: normal Last Dexa: 11/2017-managed by PCP  Past medical history, past surgical history, family history and social history were all reviewed and documented in the EPIC chart. Widowed. 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   ROS:  A ROS was performed and pertinent positives and negatives are included.  Exam:  Vitals:   02/27/21 1009  BP: 118/74  Weight: 125 lb (56.7 kg)  Height: 5' (1.524 m)   Body mass index is 24.41 kg/m.  General appearance:  Normal Thyroid:  Symmetrical, normal in size, without palpable masses or nodularity. Respiratory  Auscultation:  Clear without wheezing or rhonchi Cardiovascular  Auscultation:  Regular rate, without rubs, murmurs or gallops  Edema/varicosities:  Not grossly evident Abdominal  Soft,nontender, without masses, guarding or rebound.  Liver/spleen:  No organomegaly noted  Hernia:  None appreciated  Skin  Inspection:  Grossly normal Breasts: Examined lying and sitting.   Right: Without masses, retractions, nipple discharge or axillary adenopathy.   Left: Without masses, retractions, nipple discharge or axillary adenopathy. Gentitourinary   Inguinal/mons:  Normal without inguinal adenopathy  External genitalia:  Normal appearing vulva with no masses, tenderness, or lesions  BUS/Urethra/Skene's glands:  Normal  Vagina:  Normal appearing with normal color and discharge, no lesions  Cervix:  Absent  Uterus:  Absent  Adnexa/parametria:     Rt: Normal in size,  without masses or tenderness.   Lt: Normal in size, without masses or tenderness.  Anus and perineum: Normal  Digital rectal exam: Normal sphincter tone without palpated masses or tenderness  Assessment/Plan:  70 y.o. G1P1 for breast and pelvic exam.   Well female exam with routine gynecological exam - Education provided on SBEs, importance of preventative screenings, current guidelines, high calcium diet, regular exercise, and multivitamin daily. Labs with PCP.   History of total vaginal hysterectomy (TVH) - at age 21 for fibroids  Postmenopausal - no HRT  Screening for cervical cancer - Normal Pap history.  No longer screening per guidelines.   Screening for breast cancer - Normal mammogram history.  Continue annual screenings.  Normal breast exam today.  Screening for colon cancer - 2018 colonoscopy. Will repeat at GI's recommended interval.   Screening for osteoporosis - normal DXA in 2019. Managed by PCP. Taking daily vitamin D supplement and walking regularly.   Return in 1 year for breast and pelvic exam.   Olivia Mackie DNP, 10:26 AM 02/27/2021

## 2021-02-27 NOTE — Patient Instructions (Signed)
Health Maintenance After Age 71 After age 71, you are at a higher risk for certain long-term diseases and infections as well as injuries from falls. Falls are a major cause of broken bones and head injuries in people who are older than age 71. Getting regular preventive care can help to keep you healthy and well. Preventive care includes getting regular testing and making lifestyle changes as recommended by your health care provider. Talk with your health care provider about:  Which screenings and tests you should have. A screening is a test that checks for a disease when you have no symptoms.  A diet and exercise plan that is right for you. What should I know about screenings and tests to prevent falls? Screening and testing are the best ways to find a health problem early. Early diagnosis and treatment give you the best chance of managing medical conditions that are common after age 71. Certain conditions and lifestyle choices may make you more likely to have a fall. Your health care provider may recommend:  Regular vision checks. Poor vision and conditions such as cataracts can make you more likely to have a fall. If you wear glasses, make sure to get your prescription updated if your vision changes.  Medicine review. Work with your health care provider to regularly review all of the medicines you are taking, including over-the-counter medicines. Ask your health care provider about any side effects that may make you more likely to have a fall. Tell your health care provider if any medicines that you take make you feel dizzy or sleepy.  Osteoporosis screening. Osteoporosis is a condition that causes the bones to get weaker. This can make the bones weak and cause them to break more easily.  Blood pressure screening. Blood pressure changes and medicines to control blood pressure can make you feel dizzy.  Strength and balance checks. Your health care provider may recommend certain tests to check your  strength and balance while standing, walking, or changing positions.  Foot health exam. Foot pain and numbness, as well as not wearing proper footwear, can make you more likely to have a fall.  Depression screening. You may be more likely to have a fall if you have a fear of falling, feel emotionally low, or feel unable to do activities that you used to do.  Alcohol use screening. Using too much alcohol can affect your balance and may make you more likely to have a fall. What actions can I take to lower my risk of falls? General instructions  Talk with your health care provider about your risks for falling. Tell your health care provider if: ? You fall. Be sure to tell your health care provider about all falls, even ones that seem minor. ? You feel dizzy, sleepy, or off-balance.  Take over-the-counter and prescription medicines only as told by your health care provider. These include any supplements.  Eat a healthy diet and maintain a healthy weight. A healthy diet includes low-fat dairy products, low-fat (lean) meats, and fiber from whole grains, beans, and lots of fruits and vegetables. Home safety  Remove any tripping hazards, such as rugs, cords, and clutter.  Install safety equipment such as grab bars in bathrooms and safety rails on stairs.  Keep rooms and walkways well-lit. Activity  Follow a regular exercise program to stay fit. This will help you maintain your balance. Ask your health care provider what types of exercise are appropriate for you.  If you need a cane or [place],   use it as recommended by your health care provider.  Wear supportive shoes that have nonskid soles.   Lifestyle  Do not drink alcohol if your health care provider tells you not to drink.  If you drink alcohol, limit how much you have: ? 0-1 drink a day for women. ? 0-2 drinks a day for men.  Be aware of how much alcohol is in your drink. In the [place] one drink equals one typical bottle of beer (12  oz), one-half glass of wine (5 oz), or one shot of hard liquor (1 oz).  Do not use any products that contain nicotine or tobacco, such as cigarettes and e-cigarettes. If you need help quitting, ask your health care provider. Summary  Having a healthy lifestyle and getting preventive care can help to protect your health and wellness after age 71.  Screening and testing are the best way to find a health problem early and help you avoid having a fall. Early diagnosis and treatment give you the best chance for managing medical conditions that are more common for people who are older than age 71.  Falls are a major cause of broken bones and head injuries in people who are older than age 71. Take precautions to prevent a fall at home.  Work with your health care provider to learn what changes you can make to improve your health and wellness and to prevent falls. This information is not intended to replace advice given to you by your health care provider. Make sure you discuss any questions you have with your health care provider. Document Revised: 02/05/2019 Document Reviewed: 08/28/2017 Elsevier Patient Education  2021 Elsevier Inc.  

## 2021-03-08 IMAGING — MG DIGITAL SCREENING BILAT W/ TOMO W/ CAD
5 series · 6 of 21 positions shown · non-contrast
Comparison: Previous exam(s).

CLINICAL DATA: Screening.

EXAM:
DIGITAL SCREENING BILATERAL MAMMOGRAM WITH TOMO AND CAD

[R MLO synth-2D]
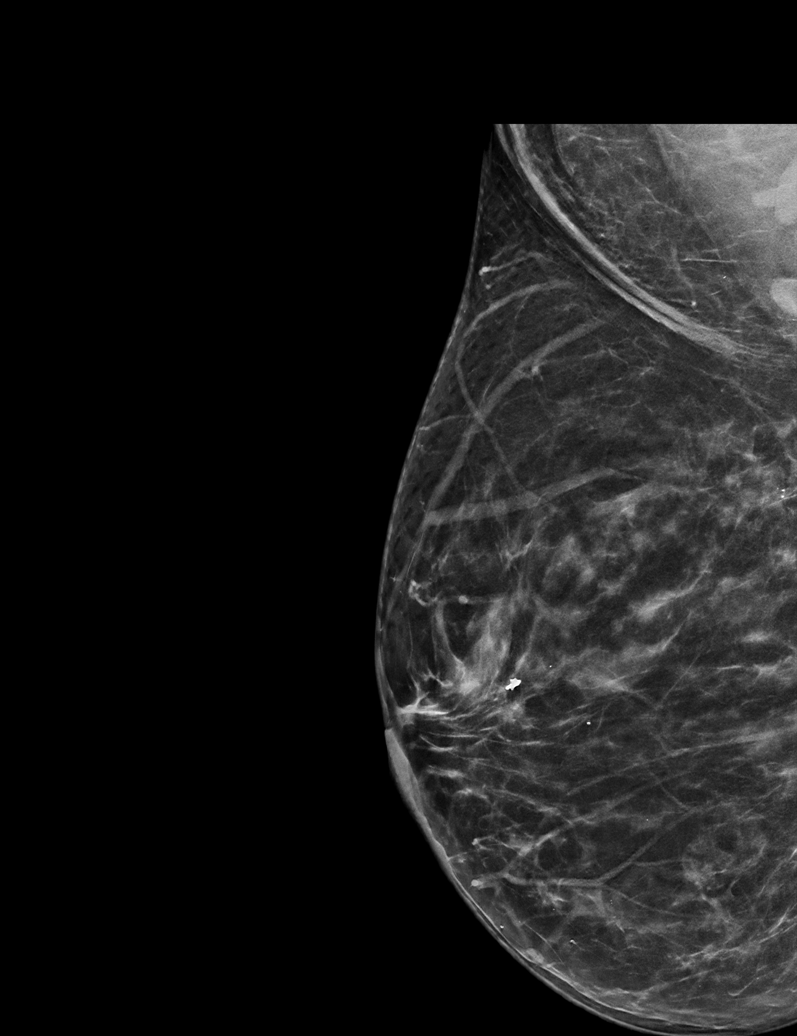

[R MLO tomo · 2 of 72 frames shown (1 of 2)]
[frame 24/72]
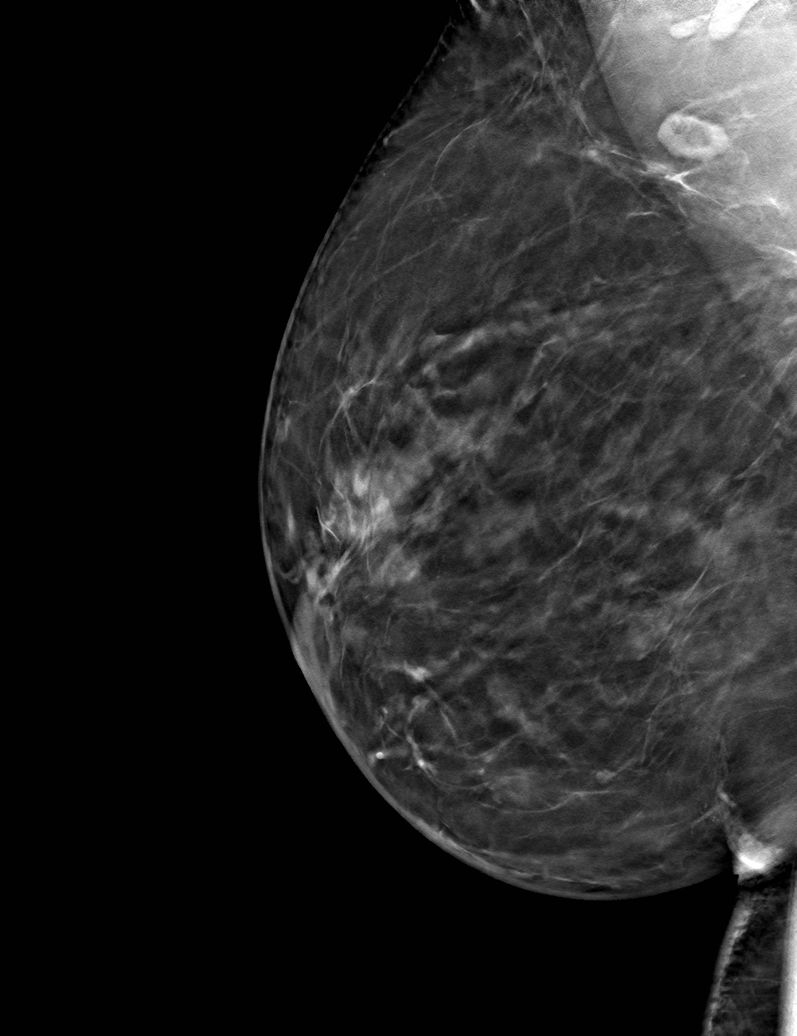
[frame 37/72]
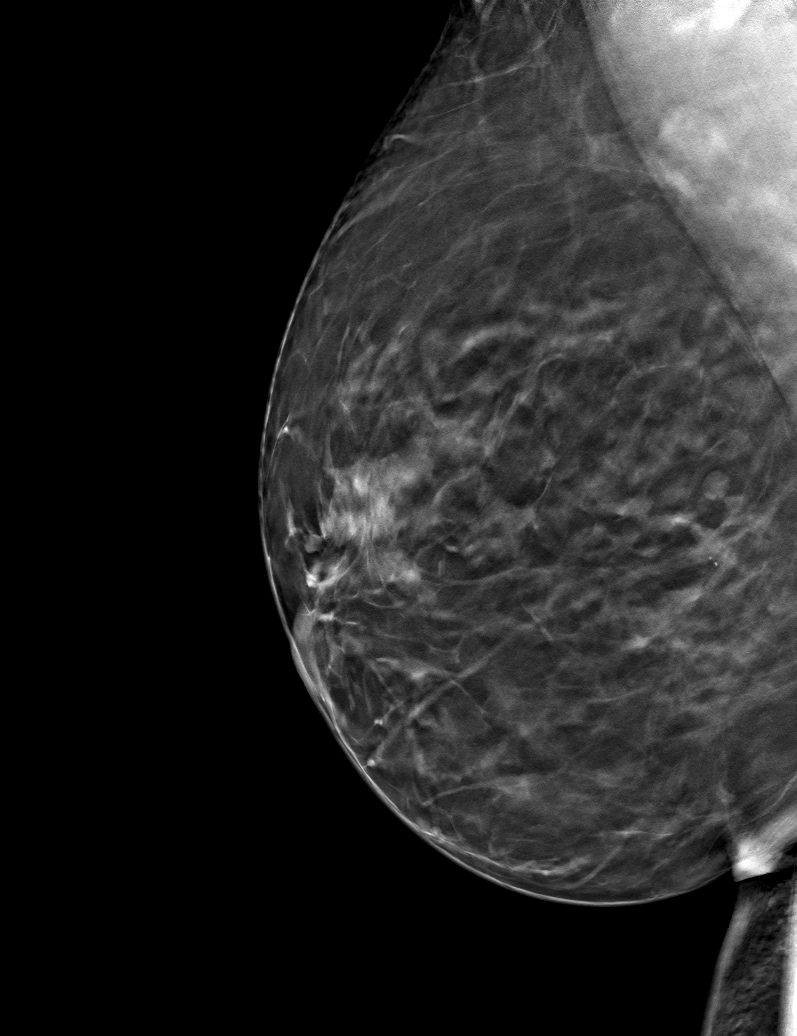

[R MLO tomo (2 of 2) · tomo slice 35/68.0]
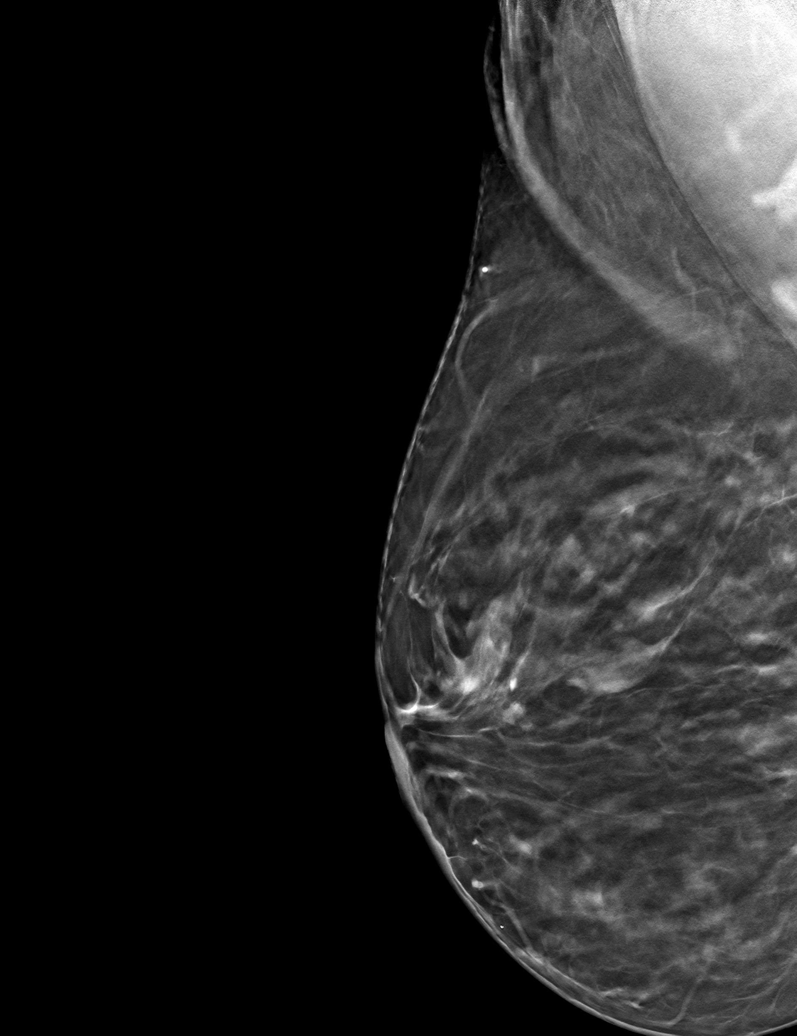

[R CC tomo · tomo slice 37/72.0]
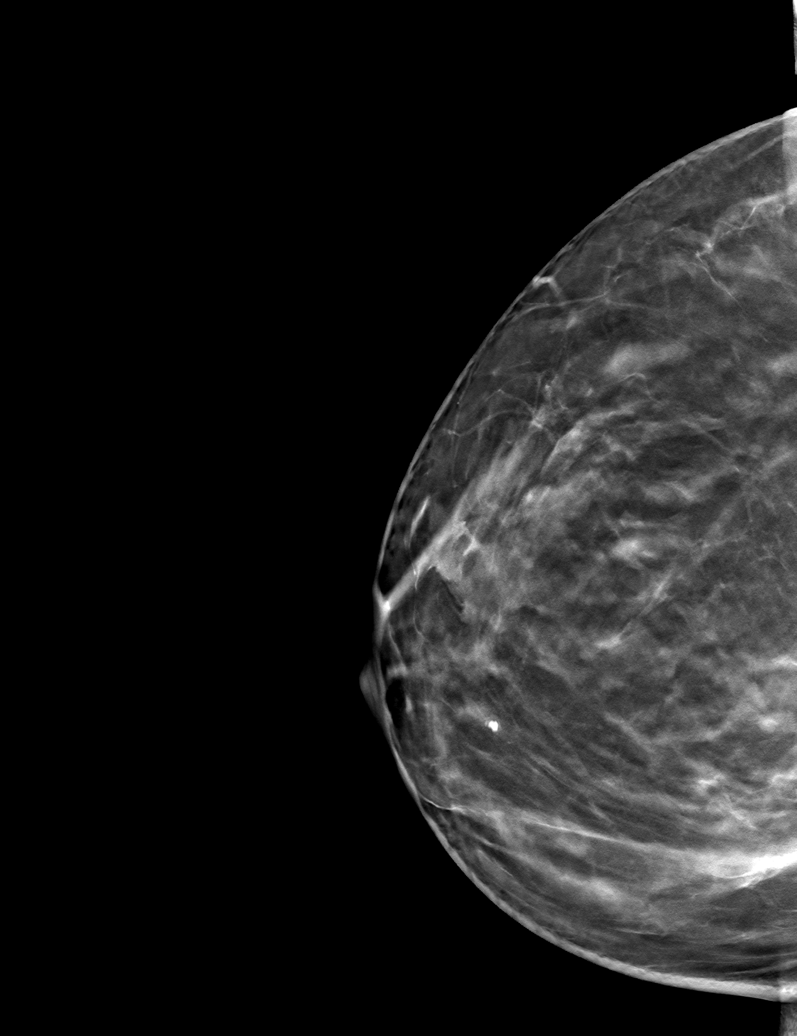

[L MLO tomo · tomo slice 41/81.0]
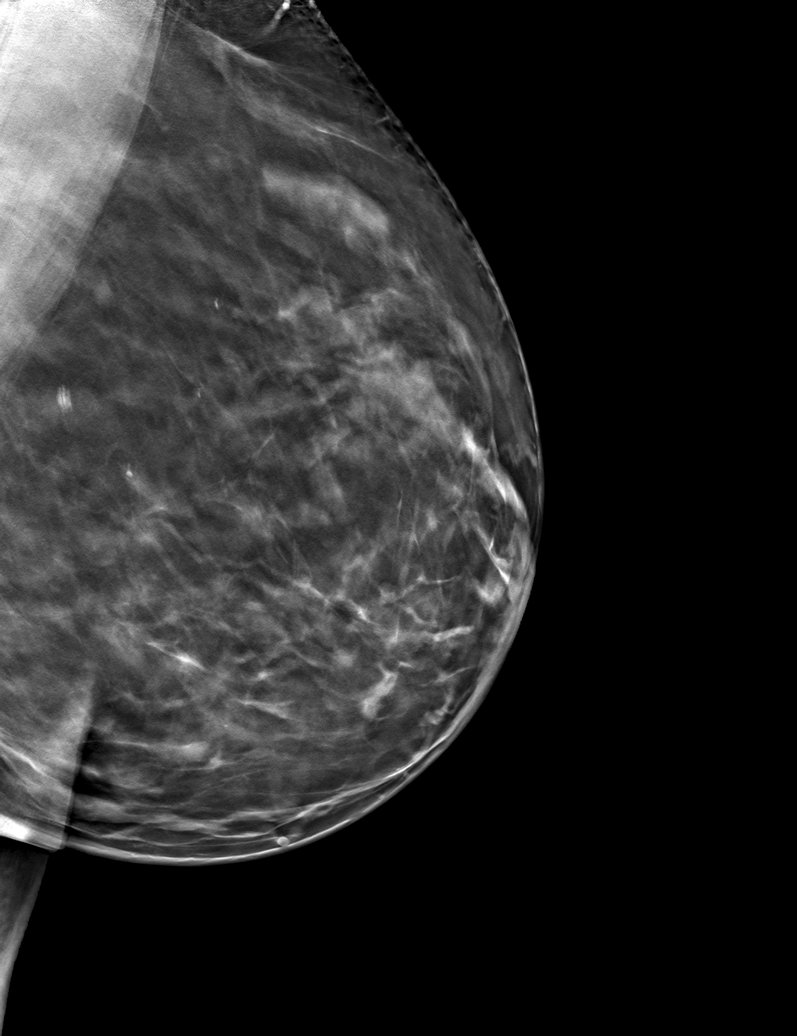

[6 of 21 positions shown; findings below may reference images not displayed]

ACR Breast Density Category c: The breast tissue is heterogeneously
dense, which may obscure small masses.
FINDINGS: There are no findings suspicious for malignancy. Images were
processed with CAD.
IMPRESSION: No mammographic evidence of malignancy. A result letter of this
screening mammogram will be mailed directly to the patient.

RECOMMENDATION:
Screening mammogram in one year. (Code:FT-U-LHB)

BI-RADS CATEGORY  1: Negative.

## 2021-05-09 ENCOUNTER — Other Ambulatory Visit: Payer: Self-pay

## 2021-05-09 ENCOUNTER — Encounter: Payer: Self-pay | Admitting: Cardiovascular Disease

## 2021-05-09 ENCOUNTER — Ambulatory Visit: Payer: Medicare Other | Admitting: Cardiovascular Disease

## 2021-05-09 VITALS — BP 124/76 | HR 68 | Ht 60.0 in | Wt 124.0 lb

## 2021-05-09 DIAGNOSIS — I10 Essential (primary) hypertension: Secondary | ICD-10-CM

## 2021-05-09 NOTE — Progress Notes (Signed)
Cardiology Office Note   Date:  05/09/2021   ID:  [name], DOB 1951/07/08, MRN [id number]  PCP:  Lupita Raider, MD  Cardiologist:  Emonee Winkowski Electrophysiologist:  None   Evaluation Performed:  Follow-Up Visit  Chief Complaint:  HTN, HLP  History of Present Illness:    Toni Shaw is a 70 y.o. female with hypertension and dyslipidemia (high LDL, low HDL), first-degree AV block.  After losing her husband and great aunt a couple of years ago, she continues to take care of her 69 year old grandmother [name], who is essentially completely blind and deaf and spends most of the day sitting in a chair not wanting to do anything.  Taking care of her causes a great deal of strain, but she is handling it well.  She has been trying to exercise and has lost weight.  BMI is now in optimal range at 24.  Her most recent lipid profile shows an excellent LDL cholesterol, but her HDL remains low at 35.  Blood pressure is excellent.  Her ECG shows an unchanged mild first-degree AV block with a PR interval at 210 ms.  The patient specifically denies any chest pain at rest exertion, dyspnea at rest or with exertion, orthopnea, paroxysmal nocturnal dyspnea, syncope, palpitations, focal neurological deficits, intermittent claudication, lower extremity edema, unexplained weight gain, cough, hemoptysis or wheezing.  She has had rare episodes of mild orthostatic hypotension.   Past Medical History:  Diagnosis Date   Elevated cholesterol    Hypertension    Past Surgical History:  Procedure Laterality Date   ABDOMINAL HYSTERECTOMY  age 27   leiomyomata   BREAST SURGERY     Reduction   REDUCTION MAMMAPLASTY       Current Meds  Medication Sig   aspirin 81 MG tablet Take 81 mg by mouth daily.   atenolol-chlorthalidone (TENORETIC) 50-25 MG per tablet Take 1 tablet by mouth daily.   atorvastatin (LIPITOR) 20 MG tablet Take 20 mg by mouth daily.   Cholecalciferol (VITAMIN D PO) Take 1,000 mg  by mouth daily.    Cranberry-Vitamin C 250-60 MG CAPS See admin instructions.   ketoconazole (NIZORAL) 2 % cream Apply 1 application topically daily. 2-3 times daily   Multiple Vitamin (MULTIVITAMIN) tablet Take 1 tablet by mouth daily.   Omega-3 Fatty Acids (FISH OIL PO) Take by mouth.   travoprost, benzalkonium, (TRAVATAN) 0.004 % ophthalmic solution 1 drop at bedtime.     Allergies:   Patient has no known allergies.   Social History   Tobacco Use   Smoking status: Never   Smokeless tobacco: Never  Vaping Use   Vaping Use: Never used  Substance Use Topics   Alcohol use: No    Alcohol/week: 0.0 standard drinks   Drug use: No     Family Hx: The patient's family history includes Breast cancer (age of onset: 44) in her mother; Cancer in her brother; Diabetes in her brother; Heart disease in her maternal grandfather and maternal grandmother.  ROS:   Please see the history of present illness.    All other systems are reviewed and are negative.   Prior CV studies:   The following studies were reviewed today:  Labs from [place] [facility] physicians December 09, 2019 (below)  Labs/Other Tests and Data Reviewed:    EKG: Ordered today and personally reviewed shows sinus rhythm with mild first-degree AV block (PR 208 ms), no ischemic changes, normal QTC  Recent Labs: No results found for requested labs within last  8760 hours.   Recent Lipid Panel No results found for: CHOL, TRIG, HDL, CHOLHDL, LDLCALC, LDLDIRECT  12/09/2019 chol 157, hdl, 37, ldl 96, tg 140 Creat 0.97, K 4.1 12/21/2020 Creatinine 0.93, potassium 4.3, normal liver function tests Cholesterol 139, HDL 37, LDL 85, triglycerides 94 Wt Readings from Last 3 Encounters:  05/09/21 124 lb (56.2 kg)  02/27/21 125 lb (56.7 kg)  04/29/20 129 lb (58.5 kg)     Objective:    Vital Signs:  BP 124/76 (BP Location: Left Arm, Patient Position: Sitting, Cuff Size: Normal)   Pulse 68   Ht 5' (1.524 m)   Wt 124 lb (56.2 kg)   BMI  24.22 kg/m     General: Alert, oriented x3, no distress, appears fit and younger than stated age Head: no evidence of trauma, PERRL, EOMI, no exophtalmos or lid lag, no myxedema, no xanthelasma; normal ears, nose and oropharynx Neck: normal jugular venous pulsations and no hepatojugular reflux; brisk carotid pulses without delay and no carotid bruits Chest: clear to auscultation, no signs of consolidation by percussion or palpation, normal fremitus, symmetrical and full respiratory excursions Cardiovascular: normal position and quality of the apical impulse, regular rhythm, normal first and second heart sounds, no murmurs, rubs or gallops Abdomen: no tenderness or distention, no masses by palpation, no abnormal pulsatility or arterial bruits, normal bowel sounds, no hepatosplenomegaly Extremities: no clubbing, cyanosis or edema; 2+ radial, ulnar and brachial pulses bilaterally; 2+ right femoral, posterior tibial and dorsalis pedis pulses; 2+ left femoral, posterior tibial and dorsalis pedis pulses; no subclavian or femoral bruits Neurological: grossly nonfocal Psych: Normal mood and affect    ASSESSMENT & PLAN:    HTN: excellent control HLP: Excellent LDL.  HDL slightly low (probably an inherited trait).  Recommend continued regular physical activity.  Patient Instructions  Medication Instructions:  No changes *If you need a refill on your cardiac medications before your next appointment, please call your pharmacy*   Lab Work: None ordered If you have labs (blood work) drawn today and your tests are completely normal, you will receive your results only by: MyChart Message (if you have MyChart) OR A paper copy in the mail If you have any lab test that is abnormal or we need to change your treatment, we will call you to review the results.   Testing/Procedures: None ordered   Follow-Up: At [facility], you and your health needs are our priority.  As part of our continuing  mission to provide you with exceptional heart care, we have created designated Provider Care Teams.  These Care Teams include your primary Cardiologist (physician) and Advanced Practice Providers (APPs -  Physician Assistants and Nurse Practitioners) who all work together to provide you with the care you need, when you need it.  We recommend signing up for the patient portal called "MyChart".  Sign up information is provided on this After Visit Summary.  MyChart is used to connect with patients for Virtual Visits (Telemedicine).  Patients are able to view lab/test results, encounter notes, upcoming appointments, etc.  Non-urgent messages can be sent to your provider as well.   To learn more about what you can do with MyChart, go to [email or web address].    Your next appointment:   12 month(s)  The format for your next appointment:   In Person  Provider:   You may see Thurmon Fair, MD or one of the following Advanced Practice Providers on your designated Care Team:   Azalee Course, PA-C Micah Flesher,  PA-C or  Judy Pimple, PA-C    Medication Adjustments/Labs and Tests Ordered: Current medicines are reviewed at length with the patient today.  Concerns regarding medicines are outlined above.   Tests Ordered: No orders of the defined types were placed in this encounter.   Medication Changes: No orders of the defined types were placed in this encounter.  There are no Patient Instructions on file for this visit.  Disposition:  Follow up  12 months  Signed, Thurmon Fair, MD  05/09/2021 9:07 AM    [facility]

## 2021-05-09 NOTE — Patient Instructions (Signed)

## 2021-10-31 ENCOUNTER — Other Ambulatory Visit: Payer: Self-pay | Admitting: Family Medicine

## 2021-10-31 DIAGNOSIS — Z1231 Encounter for screening mammogram for malignant neoplasm of breast: Secondary | ICD-10-CM

## 2021-11-23 ENCOUNTER — Ambulatory Visit: Payer: Medicare Other

## 2021-12-13 ENCOUNTER — Ambulatory Visit: Payer: Medicare Other

## 2021-12-15 ENCOUNTER — Ambulatory Visit
Admission: RE | Admit: 2021-12-15 | Discharge: 2021-12-15 | Disposition: A | Discharge status: Home / Self Care | Payer: Medicare Other | Source: Ambulatory Visit | Attending: Family Medicine | Admitting: Family Medicine

## 2021-12-15 DIAGNOSIS — Z1231 Encounter for screening mammogram for malignant neoplasm of breast: Secondary | ICD-10-CM

## 2022-01-09 ENCOUNTER — Other Ambulatory Visit: Payer: Self-pay | Admitting: Family Medicine

## 2022-01-09 DIAGNOSIS — M8580 Other specified disorders of bone density and structure, unspecified site: Secondary | ICD-10-CM

## 2022-02-28 ENCOUNTER — Ambulatory Visit: Payer: Medicare Other | Admitting: Nurse Practitioner

## 2022-04-02 ENCOUNTER — Encounter: Payer: Self-pay | Admitting: Nurse Practitioner

## 2022-04-02 ENCOUNTER — Ambulatory Visit (INDEPENDENT_AMBULATORY_CARE_PROVIDER_SITE_OTHER): Payer: Medicare Other | Admitting: Nurse Practitioner

## 2022-04-02 VITALS — BP 116/74 | HR 68 | Resp 18 | Ht 59.25 in | Wt 132.8 lb

## 2022-04-02 DIAGNOSIS — Z780 Asymptomatic menopausal state: Secondary | ICD-10-CM

## 2022-04-02 DIAGNOSIS — M8580 Other specified disorders of bone density and structure, unspecified site: Secondary | ICD-10-CM

## 2022-04-02 DIAGNOSIS — Z01419 Encounter for gynecological examination (general) (routine) without abnormal findings: Secondary | ICD-10-CM

## 2022-04-02 NOTE — Progress Notes (Signed)
   Toni Shaw 1951/09/25 [id number]   History:  71 y.o. G1P1 presents for breast and pelvic exam without GYN complaints. Postmenopausal - no HRT. S/P TAH at age 71 for fibroids. Normal pap and mammogram history. HTN, HLD, osteopenia managed by PCP.   Gynecologic History No LMP recorded. Patient has had a hysterectomy.   Contraception: status post hysterectomy Sexually active: No  Health Maintenance Last Pap: 02/17/2019. Results were: Normal Last mammogram: 12/15/2021. Results were: Normal Last colonoscopy: 2018. Results were: Normal, 10-year recall Last Dexa: 11/2017-managed by PCP. Scheduled in September  Past medical history, past surgical history, family history and social history were all reviewed and documented in the EPIC chart. Widowed. Retired from the city. 37 yo son, lives local. Mother diagnosed with breast cancer at 50.   ROS:  A ROS was performed and pertinent positives and negatives are included.  Exam:  Vitals:   04/02/22 1048  BP: 116/74  Pulse: 68  Resp: 18  Weight: 132 lb 12.8 oz (60.2 kg)  Height: 4' 11.25" (1.505 m)    Body mass index is 26.59 kg/m.  General appearance:  Normal Thyroid:  Symmetrical, normal in size, without palpable masses or nodularity. Respiratory  Auscultation:  Clear without wheezing or rhonchi Cardiovascular  Auscultation:  Regular rate, without rubs, murmurs or gallops  Edema/varicosities:  Not grossly evident Abdominal  Soft,nontender, without masses, guarding or rebound.  Liver/spleen:  No organomegaly noted  Hernia:  None appreciated  Skin  Inspection:  Grossly normal Breasts: Examined lying and sitting.   Right: Without masses, retractions, nipple discharge or axillary adenopathy.   Left: Without masses, retractions, nipple discharge or axillary adenopathy. Gentitourinary   Inguinal/mons:  Normal without inguinal adenopathy  External genitalia:  Normal appearing vulva with no masses, tenderness, or  lesions  BUS/Urethra/Skene's glands:  Normal  Vagina:  Normal appearing with normal color and discharge, no lesions  Cervix:  Absent  Uterus:  Absent  Adnexa/parametria:     Rt: Normal in size, without masses or tenderness.   Lt: Normal in size, without masses or tenderness.  Anus and perineum: Normal  Digital rectal exam: Normal sphincter tone without palpated masses or tenderness  Patient informed chaperone available to be present for breast and pelvic exam. Patient has requested no chaperone to be present. Patient has been advised what will be completed during breast and pelvic exam.   Assessment/Plan:  71 y.o. G1P1 for breast and pelvic exam.   Well female exam with routine gynecological exam - Education provided on SBEs, importance of preventative screenings, current guidelines, high calcium diet, regular exercise, and multivitamin daily. Labs with PCP.   Postmenopausal - no HRT. S/P TAH for fibroids at age 71.   Osteopenia, unspecified location - managed by PCP. DXA scheduled in September. Recommend regular exercise with resistance training, continue Vitamin D + calcium.   Screening for cervical cancer - Normal Pap history.  No longer screening per guidelines.   Screening for breast cancer - Normal mammogram history.  Continue annual screenings.  Normal breast exam today.  Screening for colon cancer - 2018 colonoscopy. Will repeat at 10-year interval per GI's recommendation.   Return in 2 years for breast and pelvic exam.    Olivia Mackie DNP, 11:14 AM 04/02/2022

## 2022-05-10 ENCOUNTER — Encounter: Payer: Self-pay | Admitting: Cardiovascular Disease

## 2022-05-10 ENCOUNTER — Ambulatory Visit: Payer: Medicare Other | Admitting: Cardiovascular Disease

## 2022-05-10 VITALS — BP 160/80 | HR 68 | Ht 59.0 in | Wt 132.0 lb

## 2022-05-10 DIAGNOSIS — I440 Atrioventricular block, first degree: Secondary | ICD-10-CM

## 2022-05-10 DIAGNOSIS — E785 Hyperlipidemia, unspecified: Secondary | ICD-10-CM

## 2022-05-10 DIAGNOSIS — I10 Essential (primary) hypertension: Secondary | ICD-10-CM | POA: Diagnosis not present

## 2022-05-10 NOTE — Progress Notes (Signed)
Cardiology Office Note   Date:  05/10/2022   ID:  SAHAR RYBACK, DOB [birth year], MRN [id number]  PCP:  Lupita Raider, MD  Cardiologist:  Bawi Lakins Electrophysiologist:  None   Evaluation Performed:  Follow-Up Visit  Chief Complaint:  HTN, HLP  History of Present Illness:    Toni Shaw is a 71 y.o. female with hypertension and dyslipidemia (high LDL, low HDL), first-degree AV block.  After losing her husband and great aunt a couple of years ago,  her 37 year old grandmother [name] also passed away. She admits to going through mild depression, but feels that she is recovering.  She has occasional brief shooting pain radiating from the spine across the intercostal space towards her sternum in the seventh left intercostal space.  This occurs only when she lies in bed at night.  She has no exertional chest discomfort.  She is scheduled for a bone density scan next month.  She had some mild problems with orthostatic dizziness that resolved when she started taking her antihypertensive medication night.  She checks her blood pressure periodically at home and it is consistently in the mid to high 120s/70s.  When she last saw Dr. Clelia Croft her blood pressure was 116/74, just a month ago.  Today her blood pressure is unexpectedly high at 163/81 and did not decrease even after resting for 15 minutes.  She has gained just a little bit of weight and her BMI has increased from 24-26.  Lipid profile performed earlier this year showed excellent total and LDL cholesterol levels, but she continues to have a borderline low HDL at 37.  ECG is unchanged and shows sinus rhythm with a mild first-degree AV block at 230 ms, otherwise normal tracing.  The patient specifically denies any chest pain at rest exertion, dyspnea at rest or with exertion, orthopnea, paroxysmal nocturnal dyspnea, syncope, palpitations, focal neurological deficits, intermittent claudication, lower extremity edema, unexplained weight  gain, cough, hemoptysis or wheezing.   Past Medical History:  Diagnosis Date   Elevated cholesterol    Hypertension    Past Surgical History:  Procedure Laterality Date   ABDOMINAL HYSTERECTOMY  age 40   leiomyomata   BREAST SURGERY     Reduction   REDUCTION MAMMAPLASTY       Current Meds  Medication Sig   aspirin 81 MG tablet Take 81 mg by mouth daily.   atenolol-chlorthalidone (TENORETIC) 50-25 MG per tablet Take 1 tablet by mouth daily.   atorvastatin (LIPITOR) 20 MG tablet Take 20 mg by mouth daily.   brimonidine-timolol (COMBIGAN) 0.2-0.5 % ophthalmic solution Place 1 drop into both eyes 2 (two) times daily.   Cholecalciferol (VITAMIN D PO) Take 1,000 mg by mouth daily.    Cranberry-Vitamin C 250-60 MG CAPS See admin instructions.   Multiple Vitamin (MULTIVITAMIN) tablet Take 1 tablet by mouth daily.   Omega-3 Fatty Acids (FISH OIL PO) Take by mouth.   travoprost, benzalkonium, (TRAVATAN) 0.004 % ophthalmic solution 1 drop at bedtime.     Allergies:   Patient has no known allergies.   Social History   Tobacco Use   Smoking status: Never   Smokeless tobacco: Never  Vaping Use   Vaping Use: Never used  Substance Use Topics   Alcohol use: No    Alcohol/week: 0.0 standard drinks of alcohol   Drug use: No     Family Hx: The patient's family history includes Breast cancer (age of onset: 65) in her mother; Cancer in her brother; Diabetes in her  brother; Heart disease in her maternal grandfather and maternal grandmother.  ROS:   Please see the history of present illness.    All other systems are reviewed and are negative.   Prior CV studies:   The following studies were reviewed today:  Labs from [place] physicians January 02, 2019 2020  Labs/Other Tests and Data Reviewed:    EKG: Ordered today and personally reviewed shows sinus rhythm with mild first-degree AV block (PR 208 ms), no ischemic changes, normal QTC  Recent Labs: No results found for requested labs  within last 365 days.   Recent Lipid Panel No results found for: "CHOL", "TRIG", "HDL", "CHOLHDL", "LDLCALC", "LDLDIRECT"  12/09/2019 chol 157, hdl, 37, ldl 96, tg 140 Creat 0.97, K 4.1 12/21/2020 Creatinine 0.93, potassium 4.3, normal liver function tests Cholesterol 139, HDL 37, LDL 85, triglycerides 94  93/81/0175 Cholesterol 151, HDL 37, LDL 95 point triglycerides 103 Creatinine 0.91, potassium 3.9, ALT 22  Wt Readings from Last 3 Encounters:  05/10/22 132 lb (59.9 kg)  04/02/22 132 lb 12.8 oz (60.2 kg)  05/09/21 124 lb (56.2 kg)     Objective:    Vital Signs:  BP (!) 160/80   Pulse 68   Ht 4\' 11"  (1.499 m)   Wt 132 lb (59.9 kg)   SpO2 96%   BMI 26.66 kg/m     General: Alert, oriented x3, no distress, appears well Head: no evidence of trauma, PERRL, EOMI, no exophtalmos or lid lag, no myxedema, no xanthelasma; normal ears, nose and oropharynx Neck: normal jugular venous pulsations and no hepatojugular reflux; brisk carotid pulses without delay and no carotid bruits Chest: clear to auscultation, no signs of consolidation by percussion or palpation, normal fremitus, symmetrical and full respiratory excursions Cardiovascular: normal position and quality of the apical impulse, regular rhythm, normal first and second heart sounds, no murmurs, rubs or gallops Abdomen: no tenderness or distention, no masses by palpation, no abnormal pulsatility or arterial bruits, normal bowel sounds, no hepatosplenomegaly Extremities: no clubbing, cyanosis or edema; 2+ radial, ulnar and brachial pulses bilaterally; 2+ right femoral, posterior tibial and dorsalis pedis pulses; 2+ left femoral, posterior tibial and dorsalis pedis pulses; no subclavian or femoral bruits Neurological: grossly nonfocal Psych: Normal mood and affect   ASSESSMENT & PLAN:    1. Essential hypertension   2. Dyslipidemia (high LDL; low HDL)   3. First degree atrioventricular block      HTN: Usually has excellent  control.  Unusually high systolic blood pressure today.  Asked her to keep a log at home.  No changes were made to antihypertensives at this time. HLP: HDL continues to be rather low but this is probably an inherited trait.  Encouraged her to restart physical activity. First-degree AV block: No symptoms to suggest high-grade AV block.  I think it safe to continue her beta-blocker for now.  If we need to change her antihypertensive medications, we will discontinue the atenolol and use a medication without negative chronotropic effect such as an ARB or peripherally acting calcium channel blocker.    HYPERTENSION CONTROL Vitals:   05/10/22 0857 05/10/22 0932  BP: (!) 163/81 (!) 160/80    The patient's blood pressure is elevated above target today.  The following intervention was performed to address the patient's elevated BP:  - Blood pressure will be monitored at home to determine if medication changes need to be made.       Patient Instructions  Medication Instructions:  No changes *If you need  a refill on your cardiac medications before your next appointment, please call your pharmacy*   Lab Work: None ordered If you have labs (blood work) drawn today and your tests are completely normal, you will receive your results only by: MyChart Message (if you have MyChart) OR A paper copy in the mail If you have any lab test that is abnormal or we need to change your treatment, we will call you to review the results.   Testing/Procedures: None ordered   Follow-Up: At [facility], you and your health needs are our priority.  As part of our continuing mission to provide you with exceptional heart care, we have created designated Provider Care Teams.  These Care Teams include your primary Cardiologist (physician) and Advanced Practice Providers (APPs -  Physician Assistants and Nurse Practitioners) who all work together to provide you with the care you need, when you need it.  We  recommend signing up for the patient portal called "MyChart".  Sign up information is provided on this After Visit Summary.  MyChart is used to connect with patients for Virtual Visits (Telemedicine).  Patients are able to view lab/test results, encounter notes, upcoming appointments, etc.  Non-urgent messages can be sent to your provider as well.   To learn more about what you can do with MyChart, go to [email or web address].    Your next appointment:   12 month(s)  The format for your next appointment:   In Person  Provider:   Thurmon Fair, MD {  Other Instructions Dr. Royann Shivers would like you to check your blood pressure daily for the next week.  Keep a journal of these daily blood pressure and heart rate readings and call our office or send a message through MyChart with the results. Thank you!  It is best to check your BP 1-2 hours after taking your medications to see the medications effectiveness on your BP.    Here are some tips that our clinical pharmacists share for home BP monitoring:          Rest 10 minutes before taking your blood pressure.          Don't smoke or drink caffeinated beverages for at least 30 minutes before.          Take your blood pressure before (not after) you eat.          Sit comfortably with your back supported and both feet on the floor (don't cross your legs).          Elevate your arm to heart level on a table or a desk.          Use the proper sized cuff. It should fit smoothly and snugly around your bare upper arm. There should be enough room to slip a fingertip under the cuff. The bottom edge of the cuff should be 1 inch above the crease of the elbow.   Important Information About Sugar        Medication Adjustments/Labs and Tests Ordered: Current medicines are reviewed at length with the patient today.  Concerns regarding medicines are outlined above.   Tests Ordered: Orders Placed This Encounter  Procedures   EKG 12-Lead      Medication Changes: No orders of the defined types were placed in this encounter.   Patient Instructions  Medication Instructions:  No changes *If you need a refill on your cardiac medications before your next appointment, please call your pharmacy*   Lab Work: None ordered If you have  labs (blood work) drawn today and your tests are completely normal, you will receive your results only by: MyChart Message (if you have MyChart) OR A paper copy in the mail If you have any lab test that is abnormal or we need to change your treatment, we will call you to review the results.   Testing/Procedures: None ordered   Follow-Up: At [facility], you and your health needs are our priority.  As part of our continuing mission to provide you with exceptional heart care, we have created designated Provider Care Teams.  These Care Teams include your primary Cardiologist (physician) and Advanced Practice Providers (APPs -  Physician Assistants and Nurse Practitioners) who all work together to provide you with the care you need, when you need it.  We recommend signing up for the patient portal called "MyChart".  Sign up information is provided on this After Visit Summary.  MyChart is used to connect with patients for Virtual Visits (Telemedicine).  Patients are able to view lab/test results, encounter notes, upcoming appointments, etc.  Non-urgent messages can be sent to your provider as well.   To learn more about what you can do with MyChart, go to [email or web address].    Your next appointment:   12 month(s)  The format for your next appointment:   In Person  Provider:   Thurmon Fair, MD {  Other Instructions Dr. Royann Shivers would like you to check your blood pressure daily for the next week.  Keep a journal of these daily blood pressure and heart rate readings and call our office or send a message through MyChart with the results. Thank you!  It is best to check your BP 1-2  hours after taking your medications to see the medications effectiveness on your BP.    Here are some tips that our clinical pharmacists share for home BP monitoring:          Rest 10 minutes before taking your blood pressure.          Don't smoke or drink caffeinated beverages for at least 30 minutes before.          Take your blood pressure before (not after) you eat.          Sit comfortably with your back supported and both feet on the floor (don't cross your legs).          Elevate your arm to heart level on a table or a desk.          Use the proper sized cuff. It should fit smoothly and snugly around your bare upper arm. There should be enough room to slip a fingertip under the cuff. The bottom edge of the cuff should be 1 inch above the crease of the elbow.   Important Information About Sugar        Disposition:  Follow up  12 months  Signed, Thurmon Fair, MD  05/10/2022 9:38 AM    [facility]

## 2022-05-10 NOTE — Patient Instructions (Signed)
Medication Instructions:  No changes *If you need a refill on your cardiac medications before your next appointment, please call your pharmacy*   Lab Work: None ordered If you have labs (blood work) drawn today and your tests are completely normal, you will receive your results only by: MyChart Message (if you have MyChart) OR A paper copy in the mail If you have any lab test that is abnormal or we need to change your treatment, we will call you to review the results.   Testing/Procedures: None ordered   Follow-Up: At [facility], you and your health needs are our priority.  As part of our continuing mission to provide you with exceptional heart care, we have created designated Provider Care Teams.  These Care Teams include your primary Cardiologist (physician) and Advanced Practice Providers (APPs -  Physician Assistants and Nurse Practitioners) who all work together to provide you with the care you need, when you need it.  We recommend signing up for the patient portal called "MyChart".  Sign up information is provided on this After Visit Summary.  MyChart is used to connect with patients for Virtual Visits (Telemedicine).  Patients are able to view lab/test results, encounter notes, upcoming appointments, etc.  Non-urgent messages can be sent to your provider as well.   To learn more about what you can do with MyChart, go to [email or web address].    Your next appointment:   12 month(s)  The format for your next appointment:   In Person  Provider:   Thurmon Fair, MD {  Other Instructions Dr. Royann Shivers would like you to check your blood pressure daily for the next week.  Keep a journal of these daily blood pressure and heart rate readings and call our office or send a message through MyChart with the results. Thank you!  It is best to check your BP 1-2 hours after taking your medications to see the medications effectiveness on your BP.    Here are some tips that our  clinical pharmacists share for home BP monitoring:          Rest 10 minutes before taking your blood pressure.          Don't smoke or drink caffeinated beverages for at least 30 minutes before.          Take your blood pressure before (not after) you eat.          Sit comfortably with your back supported and both feet on the floor (don't cross your legs).          Elevate your arm to heart level on a table or a desk.          Use the proper sized cuff. It should fit smoothly and snugly around your bare upper arm. There should be enough room to slip a fingertip under the cuff. The bottom edge of the cuff should be 1 inch above the crease of the elbow.   Important Information About Sugar

## 2022-05-30 ENCOUNTER — Encounter: Payer: Self-pay | Admitting: Cardiovascular Disease

## 2022-07-18 ENCOUNTER — Ambulatory Visit
Admission: RE | Admit: 2022-07-18 | Discharge: 2022-07-18 | Disposition: A | Discharge status: Home / Self Care | Payer: Medicare Other | Source: Ambulatory Visit | Attending: Family Medicine | Admitting: Family Medicine

## 2022-07-18 DIAGNOSIS — M8580 Other specified disorders of bone density and structure, unspecified site: Secondary | ICD-10-CM

## 2022-11-16 ENCOUNTER — Other Ambulatory Visit: Payer: Self-pay | Admitting: Family Medicine

## 2022-11-16 DIAGNOSIS — Z1231 Encounter for screening mammogram for malignant neoplasm of breast: Secondary | ICD-10-CM

## 2023-01-04 ENCOUNTER — Ambulatory Visit
Admission: RE | Admit: 2023-01-04 | Discharge: 2023-01-04 | Disposition: A | Discharge status: Home / Self Care | Payer: Medicare Other | Source: Ambulatory Visit | Attending: Family Medicine | Admitting: Family Medicine

## 2023-01-04 DIAGNOSIS — Z1231 Encounter for screening mammogram for malignant neoplasm of breast: Secondary | ICD-10-CM

## 2023-01-09 LAB — LAB REPORT - SCANNED
Creatinine, POC: 413 mg/dL
EGFR: 73
Microalb Creat Ratio: 3.7
Microalbumin, Urine: 1.52

## 2023-08-02 ENCOUNTER — Encounter: Payer: Self-pay | Admitting: Cardiovascular Disease

## 2023-08-02 ENCOUNTER — Ambulatory Visit: Payer: Medicare Other | Attending: Cardiovascular Disease | Admitting: Cardiovascular Disease

## 2023-08-02 VITALS — BP 139/80 | HR 59 | Ht 59.0 in | Wt 119.0 lb

## 2023-08-02 DIAGNOSIS — I440 Atrioventricular block, first degree: Secondary | ICD-10-CM

## 2023-08-02 DIAGNOSIS — I10 Essential (primary) hypertension: Secondary | ICD-10-CM

## 2023-08-02 DIAGNOSIS — E119 Type 2 diabetes mellitus without complications: Secondary | ICD-10-CM | POA: Diagnosis not present

## 2023-08-02 DIAGNOSIS — E7800 Pure hypercholesterolemia, unspecified: Secondary | ICD-10-CM | POA: Diagnosis not present

## 2023-08-02 NOTE — Progress Notes (Signed)
   Cardiology Office Note:  .   Date:  08/02/2023  ID:  Toni Shaw, DOB 10/29/1951, MRN [id number] PCP: Lupita Raider, MD  [facility] Providers Cardiologist:  Thurmon Fair, MD    History of Present Illness: .   Toni Shaw is a 72 y.o. female  with hypertension and dyslipidemia (high LDL, low HDL), first-degree AV block.  She still has some difficult times emotionally after losing her husband, great aunt and her 61 year old grandmother [name] also passed away.  She's gone back to work part-time and is staying socially engaged.  She has not had any cardiovascular complaints. The patient specifically denies any chest pain at rest exertion, dyspnea at rest or with exertion, orthopnea, paroxysmal nocturnal dyspnea, syncope, palpitations, focal neurological deficits, intermittent claudication, lower extremity edema, unexplained weight gain, cough, hemoptysis or wheezing.  Systolic blood pressure is borderline high today at 140, but just a month ago her BP was 108/70.  Blood pressure is consistently normal when she checks it at home.  Lipid profile performed in August showed a reasonable LDL cholesterol of 100 but a low HDL at 34.  She also has full-blown diabetes mellitus with a hemoglobin A1c that is controlled at 6.7% without medications.  She thinks it may have improved since then since she has been more attention to her diet.  ROS: as above  Studies Reviewed: Marland Kitchen   EKG Interpretation Date/Time:  Friday August 02 2023 08:57:08 EDT Ventricular Rate:  59 PR Interval:  232 QRS Duration:  64 QT Interval:  406 QTC Calculation: 401 R Axis:   16  Text Interpretation: Sinus bradycardia with 1st degree A-V block Low voltage QRS No previous ECGs available Confirmed by Meika Earll ([id number]) on 08/02/2023 9:08:34 AM    06/05/2023 cholesterol 155, HDL 34, LDL 100, triglycerides 109, hemoglobin A1c 6.7%, creatinine 0.88 Risk Assessment/Calculations:             Physical  Exam:   VS:  BP 139/80   Pulse (!) 59   Ht 4\' 11"  (1.499 m)   Wt 119 lb (54 kg)   SpO2 96%   BMI 24.04 kg/m    Wt Readings from Last 3 Encounters:  08/02/23 119 lb (54 kg)  05/10/22 132 lb (59.9 kg)  04/02/22 132 lb 12.8 oz (60.2 kg)    GEN: Well nourished, well developed in no acute distress NECK: No JVD; No carotid bruits CARDIAC: RRR, no murmurs, rubs, gallops RESPIRATORY:  Clear to auscultation without rales, wheezing or rhonchi  ABDOMEN: Soft, non-tender, non-distended EXTREMITIES:  No edema; No deformity   Wt Readings from Last 3 Encounters:  08/02/23 119 lb (54 kg)  05/10/22 132 lb (59.9 kg)  04/02/22 132 lb 12.8 oz (60.2 kg)   ASSESSMENT AND PLAN: .   HTN: Borderline elevated today.  No changes made to medications. HLP: HDL continues to be rather low but this is probably an inherited trait.  Increase physical activity is the best way to improve this. DM: Controlled with diet. First-degree AV block: May be age-related, but also secondary to her beta-blocker.  Asymptomatic.  No reason to change medications at this time.       Dispo: f/u 1 year  Signed, Thurmon Fair, MD

## 2023-08-02 NOTE — Patient Instructions (Signed)

## 2023-11-27 ENCOUNTER — Ambulatory Visit: Payer: Medicare Other | Admitting: Nurse Practitioner

## 2023-11-28 ENCOUNTER — Other Ambulatory Visit: Payer: Self-pay | Admitting: Family Medicine

## 2023-11-28 DIAGNOSIS — N644 Mastodynia: Secondary | ICD-10-CM

## 2023-12-25 ENCOUNTER — Ambulatory Visit
Admission: RE | Admit: 2023-12-25 | Discharge: 2023-12-25 | Disposition: A | Discharge status: Home / Self Care | Payer: Medicare Other | Source: Ambulatory Visit | Attending: Family Medicine | Admitting: Family Medicine

## 2023-12-25 ENCOUNTER — Ambulatory Visit: Payer: Medicare Other

## 2023-12-25 DIAGNOSIS — N644 Mastodynia: Secondary | ICD-10-CM

## 2024-01-14 LAB — LAB REPORT - SCANNED
A1c: 6.7
Creatinine, POC: 82 mg/dL
EGFR: 72

## 2024-01-15 ENCOUNTER — Encounter: Payer: Self-pay | Admitting: Family Medicine

## 2024-04-06 ENCOUNTER — Ambulatory Visit (INDEPENDENT_AMBULATORY_CARE_PROVIDER_SITE_OTHER): Payer: Medicare Other | Admitting: Nurse Practitioner

## 2024-04-06 ENCOUNTER — Encounter: Payer: Self-pay | Admitting: Nurse Practitioner

## 2024-04-06 VITALS — BP 120/76 | HR 67 | Ht 59.25 in | Wt 124.0 lb

## 2024-04-06 DIAGNOSIS — Z01419 Encounter for gynecological examination (general) (routine) without abnormal findings: Secondary | ICD-10-CM | POA: Diagnosis not present

## 2024-04-06 DIAGNOSIS — Z780 Asymptomatic menopausal state: Secondary | ICD-10-CM

## 2024-04-06 DIAGNOSIS — M8580 Other specified disorders of bone density and structure, unspecified site: Secondary | ICD-10-CM

## 2024-04-06 NOTE — Progress Notes (Signed)
   Toni Shaw Apr 11, 1951 [id number]   History:  73 y.o. G1P1 presents for breast and pelvic exam without GYN complaints. Postmenopausal - no HRT. S/P TAH at age 84 for fibroids. Normal pap and mammogram history. HTN, HLD, osteopenia managed by PCP.   Gynecologic History No LMP recorded. Patient has had a hysterectomy.   Contraception: status post hysterectomy Sexually active: Yes  Health Maintenance Last Pap: 02/17/2019. Results were: Normal Last mammogram: 12/25/2023. Results were: Normal Last colonoscopy: 2018. Results were: Normal, 10-year recall Last Dexa: 07/18/2022. Results were: T-score-1.1, FRAX 4.3% / 0.6%  Past medical history, past surgical history, family history and social history were all reviewed and documented in the EPIC chart. Widowed. Retired from the city. 74 yo son, lives local. Mother diagnosed with breast cancer at 61, deceased.   ROS:  A ROS was performed and pertinent positives and negatives are included.  Exam:  Vitals:   04/06/24 0953  BP: 120/76  Pulse: 67  SpO2: 98%  Weight: 124 lb (56.2 kg)  Height: 4' 11.25" (1.505 m)     Body mass index is 24.83 kg/m.  General appearance:  Normal Thyroid :  Symmetrical, normal in size, without palpable masses or nodularity. Respiratory  Auscultation:  Clear without wheezing or rhonchi Cardiovascular  Auscultation:  Regular rate, without rubs, murmurs or gallops  Edema/varicosities:  Not grossly evident Abdominal  Soft,nontender, without masses, guarding or rebound.  Liver/spleen:  No organomegaly noted  Hernia:  None appreciated  Skin  Inspection:  Grossly normal Breasts: Examined lying and sitting.   Right: Without masses, retractions, nipple discharge or axillary adenopathy.   Left: Without masses, retractions, nipple discharge or axillary adenopathy. Pelvic: External genitalia:  no lesions              Urethra:  normal appearing urethra with no masses, tenderness or lesions               Bartholins and Skenes: normal                 Vagina: normal appearing vagina with normal color and discharge, no lesions              Cervix: absent Bimanual Exam:  Uterus: absent              Adnexa: no mass, fullness, tenderness              Rectovaginal: Deferred              Anus:  normal, no lesions  Doria Garden, NP student performed exam with observation.   Assessment/Plan:  73 y.o. G1P1 for breast and pelvic exam.   Encounter for breast and pelvic examination - Education provided on SBEs, importance of preventative screenings, current guidelines, high calcium diet, regular exercise, and multivitamin daily. Labs with PCP.   Postmenopausal - no HRT. S/P TAH for fibroids at age 89.   Osteopenia, unspecified location - T-score -1.1 without elevated FRAX. managed by PCP. DXA scheduled in September. Recommend regular exercise with resistance training, continue Vitamin D + calcium.   Screening for cervical cancer - Normal Pap history.  No longer screening per guidelines.   Screening for breast cancer - Normal mammogram history.  Continue annual screenings.  Normal breast exam today.  Screening for colon cancer - 2018 colonoscopy. Will repeat at 10-year interval per GI's recommendation.   Return in 2 years (on 04/06/2026) for B&P.   Andee Bamberger DNP, 10:24 AM 04/06/2024

## 2024-10-06 ENCOUNTER — Ambulatory Visit: Attending: Cardiovascular Disease | Admitting: Cardiovascular Disease

## 2024-10-06 ENCOUNTER — Encounter: Payer: Self-pay | Admitting: Cardiovascular Disease

## 2024-10-06 VITALS — BP 132/70 | HR 62 | Ht 59.0 in | Wt 125.6 lb

## 2024-10-06 DIAGNOSIS — E785 Hyperlipidemia, unspecified: Secondary | ICD-10-CM

## 2024-10-06 DIAGNOSIS — I440 Atrioventricular block, first degree: Secondary | ICD-10-CM

## 2024-10-06 DIAGNOSIS — I10 Essential (primary) hypertension: Secondary | ICD-10-CM

## 2024-10-06 DIAGNOSIS — Z9189 Other specified personal risk factors, not elsewhere classified: Secondary | ICD-10-CM

## 2024-10-06 DIAGNOSIS — E119 Type 2 diabetes mellitus without complications: Secondary | ICD-10-CM

## 2024-10-06 NOTE — Progress Notes (Signed)
 Cardiology Office Note:  .   Date:  10/06/2024  ID:  Toni Shaw, DOB 1951/05/21, MRN [id number] PCP: Loreli Kins, MD  [facility] Providers Cardiologist:  Jerel Balding, MD    History of Present Illness: .   Toni Shaw is a 73 y.o. female  with hypertension and dyslipidemia (high LDL, low HDL), first-degree AV block.  In the last few years she has lost her husband, her great aunt and her 63 year old grandmother and Thanksgiving was difficult.  She has been diagnosed with some type of inflammatory eye disorder and is receiving Humira every 2 weeks and is on several medications for glaucoma.  She has been paying more attention to her diet and walking more and has managed to maintain a healthy weight with a BMI around 25, accompanied by substantial improvement in her glycemic control and lipid parameters.  The patient specifically denies any chest pain at rest or with exertion, dyspnea at rest or with exertion, orthopnea, paroxysmal nocturnal dyspnea, syncope, palpitations, focal neurological deficits, intermittent claudication, lower extremity edema, unexplained weight gain, cough, hemoptysis or wheezing.  She occasionally feels a little lightheaded if she gets up quickly in the mornings, but has not had any dizziness at rest.  Labs from March 2025 showed HDL 43, LDL 76, normal triglycerides (on atorvastatin 20 mg daily) and hemoglobin A1c 6.7% (without any medications).  She has normal renal function.    Studies Reviewed: SABRA   EKG Interpretation Date/Time:  Tuesday October 06 2024 09:00:50 EST Ventricular Rate:  62 PR Interval:  234 QRS Duration:  70 QT Interval:  404 QTC Calculation: 410 R Axis:   31  Text Interpretation: Sinus rhythm with 1st degree A-V block When compared with ECG of 02-Aug-2023 08:57, No significant change was found Confirmed by Sephira Zellman ([id number]) on 10/06/2024 9:30:14 AM    06/05/2023 cholesterol 155, HDL 34, LDL 100, triglycerides 109,  hemoglobin A1c 6.7%, creatinine 0.88  01/14/2024 Cholesterol 136, HDL 43, LDL 76, triglycerides 95 Hemoglobin A1c 6.7%, potassium 4.2 Risk Assessment/Calculations:             Physical Exam:   VS:  BP 132/70 (BP Location: Left Arm, Patient Position: Sitting, Cuff Size: Normal)   Pulse 62   Ht 4' 11 (1.499 m)   Wt 125 lb 9.6 oz (57 kg)   SpO2 99%   BMI 25.37 kg/m    Wt Readings from Last 3 Encounters:  10/06/24 125 lb 9.6 oz (57 kg)  04/06/24 124 lb (56.2 kg)  08/02/23 119 lb (54 kg)     General: Alert, oriented x3, no distress, appears fit Head: no evidence of trauma, PERRL, EOMI, no exophtalmos or lid lag, no myxedema, no xanthelasma; normal ears, nose and oropharynx Neck: normal jugular venous pulsations and no hepatojugular reflux; brisk carotid pulses without delay and no carotid bruits Chest: clear to auscultation, no signs of consolidation by percussion or palpation, normal fremitus, symmetrical and full respiratory excursions Cardiovascular: normal position and quality of the apical impulse, regular rhythm, normal first and second heart sounds, no murmurs, rubs or gallops Abdomen: no tenderness or distention, no masses by palpation, no abnormal pulsatility or arterial bruits, normal bowel sounds, no hepatosplenomegaly Extremities: no clubbing, cyanosis or edema; 2+ radial, ulnar and brachial pulses bilaterally; 2+ right femoral, posterior tibial and dorsalis pedis pulses; 2+ left femoral, posterior tibial and dorsalis pedis pulses; no subclavian or femoral bruits Neurological: grossly nonfocal Psych: Normal mood and affect   Wt Readings from  previous 3 Encounters:  08/02/23 119 lb (54 kg)  05/10/22 132 lb (59.9 kg)  04/02/22 132 lb 12.8 oz (60.2 kg)   ASSESSMENT AND PLAN: .   HTN: Well-controlled.  Watch for hypokalemia due to the combination of acetazolamide and hydrochlorothiazide.  Watch for worsening AV block on atenolol. HLP: Remarkable improvement in her HDL  cholesterol, up by about 30%, probably due to her additional commitment to physical exercise. DM: Diet controlled First-degree AV block: Unchanged, partly due to beta-blocker therapy.  Asymptomatic.  She has a smart watch that monitors heart rate.  Asked her to contact us  if this shows excessive bradycardia, especially if associated with symptoms.       Dispo: f/u 1 year  Signed, Jerel Balding, MD

## 2024-10-06 NOTE — Patient Instructions (Signed)

## 2024-11-24 ENCOUNTER — Other Ambulatory Visit: Payer: Self-pay | Admitting: Family Medicine

## 2024-11-24 DIAGNOSIS — Z1231 Encounter for screening mammogram for malignant neoplasm of breast: Secondary | ICD-10-CM

## 2024-12-25 ENCOUNTER — Ambulatory Visit
